# Patient Record
Sex: Male | Born: 1945 | Race: White | Hispanic: No | Marital: Married | Smoking: Former smoker
Health system: Southern US, Community
[De-identification: ages and names within clinical notes are randomized; demographics above are authoritative.]

## PROBLEM LIST (undated history)

## (undated) DIAGNOSIS — K219 Gastro-esophageal reflux disease without esophagitis: Secondary | ICD-10-CM

## (undated) DIAGNOSIS — I2510 Atherosclerotic heart disease of native coronary artery without angina pectoris: Secondary | ICD-10-CM

## (undated) DIAGNOSIS — F17201 Nicotine dependence, unspecified, in remission: Secondary | ICD-10-CM

## (undated) DIAGNOSIS — M6530 Trigger finger, unspecified finger: Secondary | ICD-10-CM

## (undated) DIAGNOSIS — F419 Anxiety disorder, unspecified: Secondary | ICD-10-CM

## (undated) DIAGNOSIS — C801 Malignant (primary) neoplasm, unspecified: Secondary | ICD-10-CM

## (undated) DIAGNOSIS — K635 Polyp of colon: Secondary | ICD-10-CM

## (undated) DIAGNOSIS — I219 Acute myocardial infarction, unspecified: Secondary | ICD-10-CM

## (undated) DIAGNOSIS — I739 Peripheral vascular disease, unspecified: Secondary | ICD-10-CM

## (undated) DIAGNOSIS — R011 Cardiac murmur, unspecified: Secondary | ICD-10-CM

## (undated) DIAGNOSIS — I10 Essential (primary) hypertension: Secondary | ICD-10-CM

## (undated) DIAGNOSIS — K8020 Calculus of gallbladder without cholecystitis without obstruction: Secondary | ICD-10-CM

## (undated) DIAGNOSIS — E785 Hyperlipidemia, unspecified: Secondary | ICD-10-CM

## (undated) DIAGNOSIS — H9190 Unspecified hearing loss, unspecified ear: Secondary | ICD-10-CM

## (undated) HISTORY — DX: Gastro-esophageal reflux disease without esophagitis: K21.9

## (undated) HISTORY — DX: Calculus of gallbladder without cholecystitis without obstruction: K80.20

## (undated) HISTORY — DX: Nicotine dependence, unspecified, in remission: F17.201

## (undated) HISTORY — DX: Atherosclerotic heart disease of native coronary artery without angina pectoris: I25.10

## (undated) HISTORY — DX: Hyperlipidemia, unspecified: E78.5

## (undated) HISTORY — DX: Trigger finger, unspecified finger: M65.30

## (undated) HISTORY — DX: Polyp of colon: K63.5

## (undated) HISTORY — PX: APPENDECTOMY: SHX54

## (undated) HISTORY — DX: Peripheral vascular disease, unspecified: I73.9

## (undated) HISTORY — PX: FINGER TENDON REPAIR: SHX1640

## (undated) HISTORY — DX: Essential (primary) hypertension: I10

## (undated) HISTORY — PX: CARDIAC CATHETERIZATION: SHX172

---

## 1994-09-12 DIAGNOSIS — I219 Acute myocardial infarction, unspecified: Secondary | ICD-10-CM

## 1994-09-12 HISTORY — DX: Acute myocardial infarction, unspecified: I21.9

## 1995-02-13 ENCOUNTER — Encounter (INDEPENDENT_AMBULATORY_CARE_PROVIDER_SITE_OTHER): Payer: Self-pay | Admitting: *Deleted

## 2005-04-14 ENCOUNTER — Ambulatory Visit: Payer: Self-pay | Admitting: Cardiology

## 2006-04-27 ENCOUNTER — Ambulatory Visit: Payer: Self-pay | Admitting: Cardiology

## 2007-05-16 ENCOUNTER — Ambulatory Visit: Payer: Self-pay | Admitting: Cardiology

## 2007-09-13 DIAGNOSIS — K635 Polyp of colon: Secondary | ICD-10-CM

## 2007-09-13 DIAGNOSIS — K8020 Calculus of gallbladder without cholecystitis without obstruction: Secondary | ICD-10-CM

## 2007-09-13 HISTORY — DX: Calculus of gallbladder without cholecystitis without obstruction: K80.20

## 2007-09-13 HISTORY — DX: Polyp of colon: K63.5

## 2007-09-13 HISTORY — PX: COLONOSCOPY W/ POLYPECTOMY: SHX1380

## 2007-09-24 ENCOUNTER — Ambulatory Visit (HOSPITAL_COMMUNITY): Admission: RE | Admit: 2007-09-24 | Discharge: 2007-09-24 | Payer: Self-pay | Admitting: Family Medicine

## 2007-10-05 ENCOUNTER — Ambulatory Visit (HOSPITAL_COMMUNITY): Admission: RE | Admit: 2007-10-05 | Discharge: 2007-10-05 | Payer: Self-pay | Admitting: Family Medicine

## 2007-10-10 ENCOUNTER — Ambulatory Visit: Payer: Self-pay | Admitting: Internal Medicine

## 2007-10-15 ENCOUNTER — Encounter: Payer: Self-pay | Admitting: Internal Medicine

## 2007-10-15 ENCOUNTER — Ambulatory Visit: Payer: Self-pay | Admitting: Internal Medicine

## 2007-10-15 ENCOUNTER — Ambulatory Visit (HOSPITAL_COMMUNITY): Admission: RE | Admit: 2007-10-15 | Discharge: 2007-10-15 | Payer: Self-pay | Admitting: Internal Medicine

## 2008-01-18 ENCOUNTER — Ambulatory Visit (HOSPITAL_COMMUNITY): Admission: RE | Admit: 2008-01-18 | Discharge: 2008-01-18 | Payer: Self-pay | Admitting: Internal Medicine

## 2008-01-18 ENCOUNTER — Ambulatory Visit: Payer: Self-pay | Admitting: Internal Medicine

## 2008-01-18 ENCOUNTER — Encounter: Payer: Self-pay | Admitting: Internal Medicine

## 2008-10-10 ENCOUNTER — Encounter: Payer: Self-pay | Admitting: Physician Assistant

## 2008-10-10 ENCOUNTER — Ambulatory Visit: Payer: Self-pay | Admitting: Cardiology

## 2008-11-07 ENCOUNTER — Encounter: Payer: Self-pay | Admitting: Gastroenterology

## 2009-01-09 ENCOUNTER — Encounter (INDEPENDENT_AMBULATORY_CARE_PROVIDER_SITE_OTHER): Payer: Self-pay | Admitting: *Deleted

## 2009-07-16 ENCOUNTER — Encounter (INDEPENDENT_AMBULATORY_CARE_PROVIDER_SITE_OTHER): Payer: Self-pay

## 2009-07-20 ENCOUNTER — Encounter: Payer: Self-pay | Admitting: Cardiology

## 2009-09-30 ENCOUNTER — Encounter (INDEPENDENT_AMBULATORY_CARE_PROVIDER_SITE_OTHER): Payer: Self-pay | Admitting: *Deleted

## 2009-11-09 ENCOUNTER — Encounter (INDEPENDENT_AMBULATORY_CARE_PROVIDER_SITE_OTHER): Payer: Self-pay | Admitting: *Deleted

## 2009-11-09 ENCOUNTER — Ambulatory Visit: Payer: Self-pay | Admitting: Cardiology

## 2009-11-09 IMAGING — RF DG UGI W/ HIGH DENSITY W/KUB
13 of 24 series · 13 of 24 positions shown · non-contrast
Comparison: none

HISTORY: Epigastric pain, gastroesophageal reflux, burning

[Series 1: run · 1 of 1 slices shown (1 of 13)]
[im 1/1]
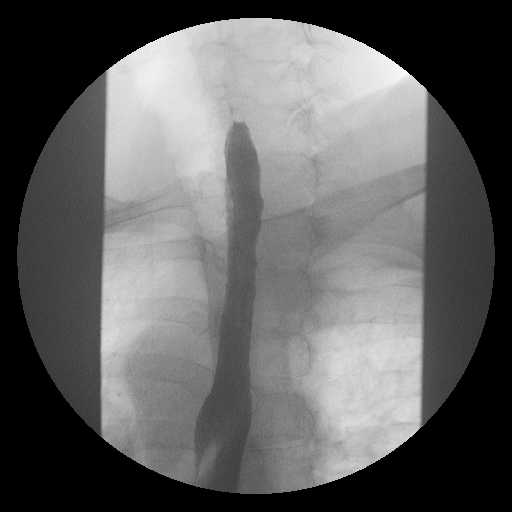

[Series 3: run · 1 of 1 slices shown (2 of 13)]
[im 1/1]
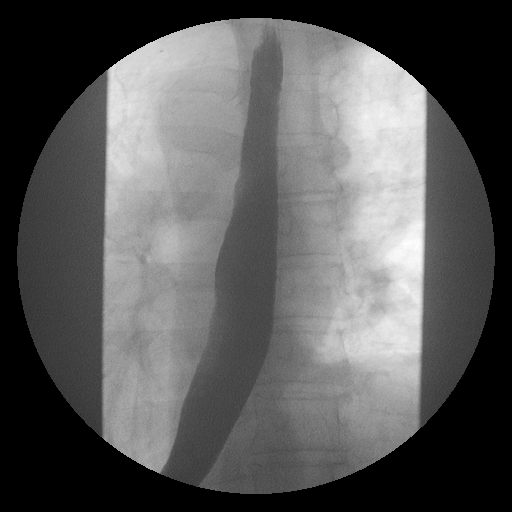

[Series 5: run · 1 of 1 slices shown (3 of 13)]
[im 1/1]
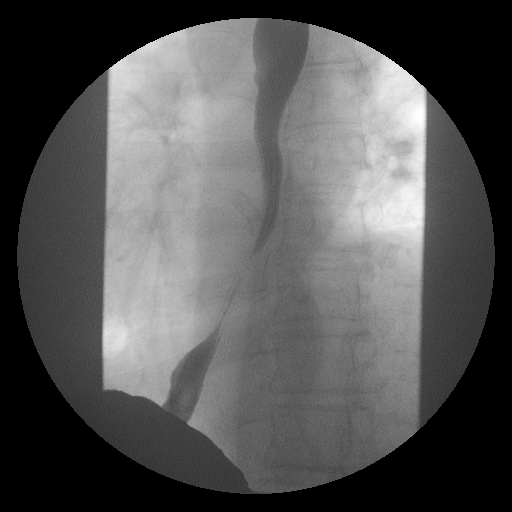

[Series 7: run · 1 of 1 slices shown (4 of 13)]
[im 1/1]
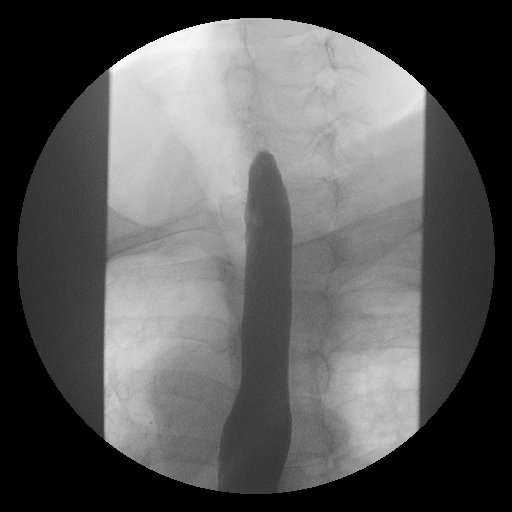

[Series 9: run · 1 of 1 slices shown (5 of 13)]
[im 1/1]
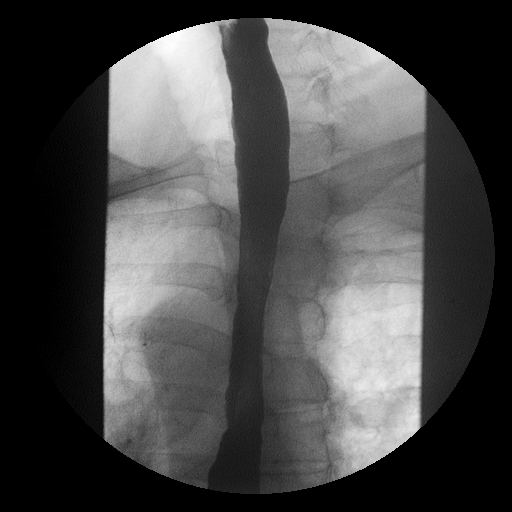

[Series 11: run · 1 of 1 slices shown (6 of 13)]
[im 1/1]
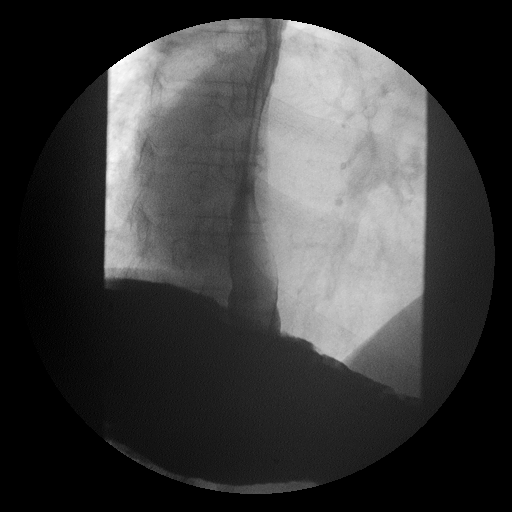

[Series 13: run · 1 of 1 slices shown (7 of 13)]
[im 1/1]
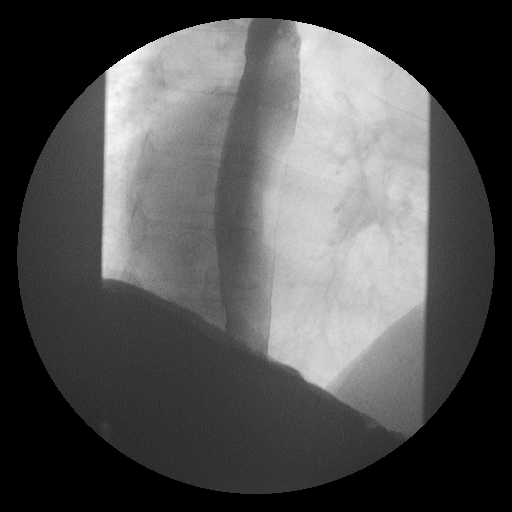

[Series 14: run · 1 of 1 slices shown (8 of 13)]
[im 1/1]
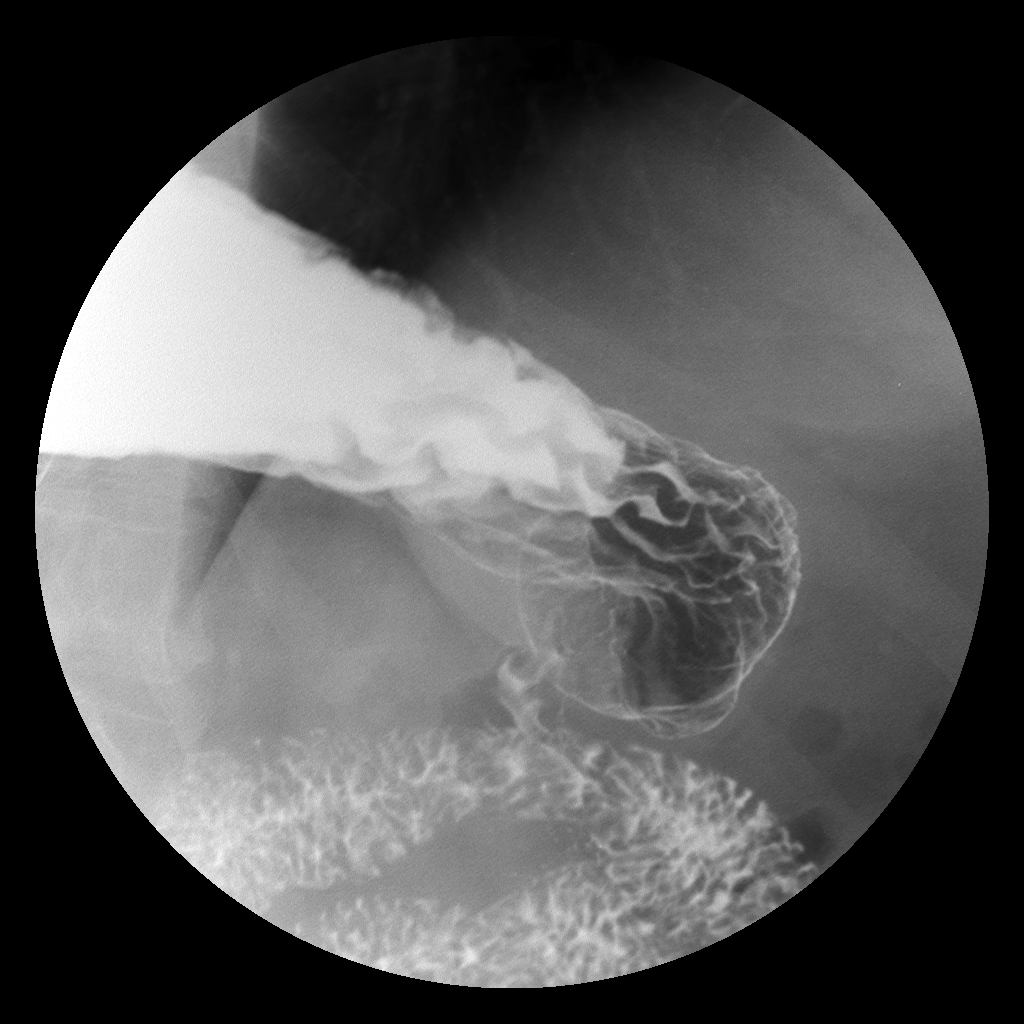

[Series 16: run · 1 of 1 slices shown (9 of 13)]
[im 1/1]
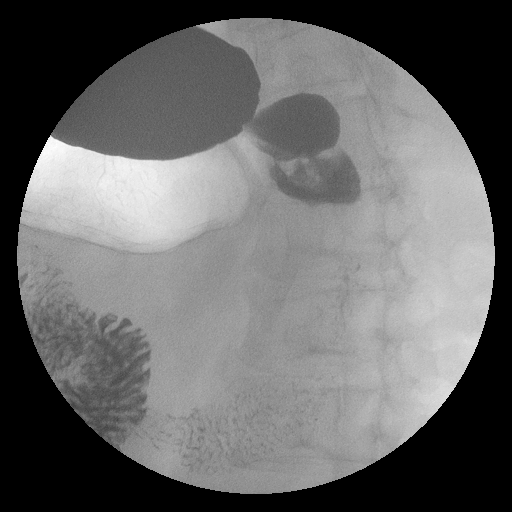

[Series 18: run · 1 of 1 slices shown (10 of 13)]
[im 1/1]
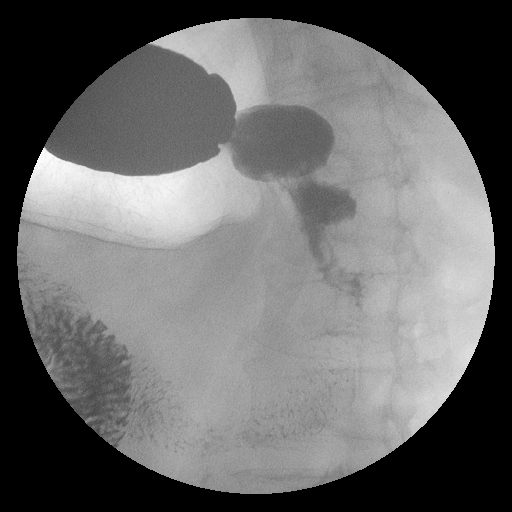

[Series 20: run · 1 of 1 slices shown (11 of 13)]
[im 1/1]
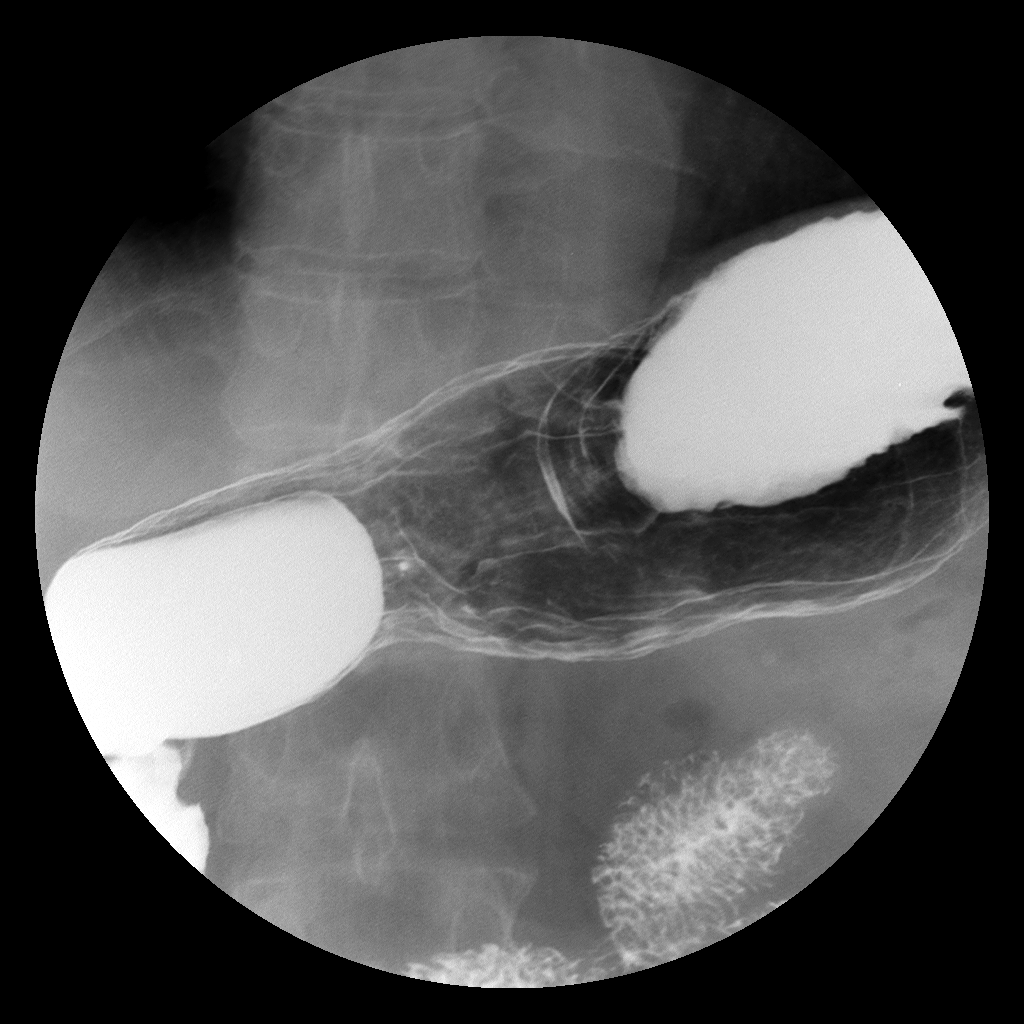

[Series 22: run · 1 of 1 slices shown (12 of 13)]
[im 1/1]
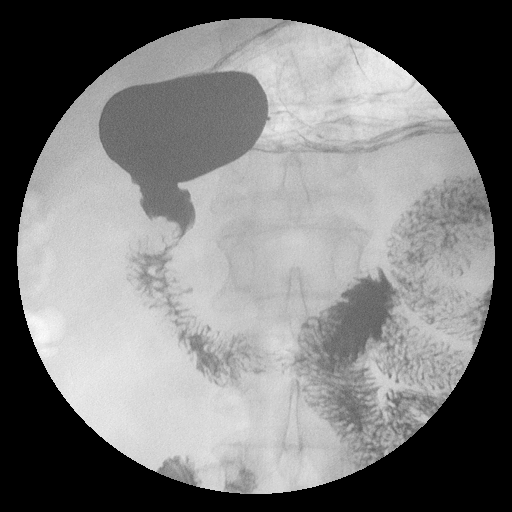

[Series 24: run · 1 of 1 slices shown (13 of 13)]
[im 1/1]
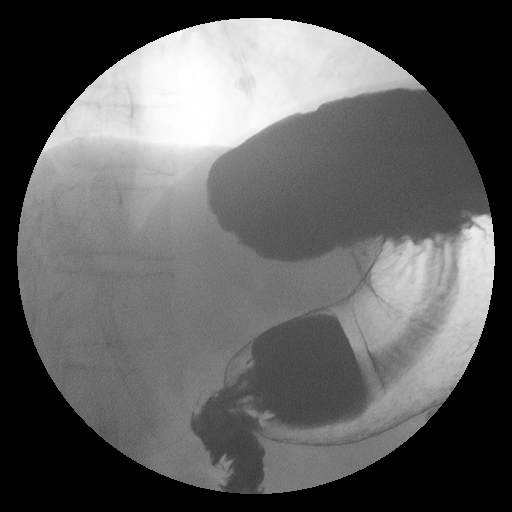

[13 of 24 positions shown; findings below may reference images not displayed]

UPPER GI WITH HIGH-DENSITY WITH KUB:

Normal bowel gas pattern on scout image.
No pathologic calcification or focal bone abnormality.
Routine air-contrast upper GI exam performed.

Normal esophageal distention and motility.
No esophageal stricture or mass.
Esophageal wall appears smooth without ulceration or irregularity. 
No persistent intraluminal filling defects.
Horizontal stomach with normal rugal fold pattern.
No gastric mass or ulceration.
Duodenal bulb and sweep normal appearance.
Duodenal loop normal morphology with normal position of ligament of Treitz.
No gastroesophageal reflux identified during exam.
Visualized jejunal loops normal.
IMPRESSION: Horizontal stomach, normal variant.
Otherwise normal exam.

## 2009-11-16 ENCOUNTER — Encounter (INDEPENDENT_AMBULATORY_CARE_PROVIDER_SITE_OTHER): Payer: Self-pay | Admitting: *Deleted

## 2009-11-16 ENCOUNTER — Encounter: Payer: Self-pay | Admitting: Cardiology

## 2009-11-16 LAB — CONVERTED CEMR LAB
ALT: 22 units/L (ref 0–53)
AST: 17 units/L
AST: 17 units/L (ref 0–37)
Alkaline Phosphatase: 40 units/L
BUN: 10 mg/dL
Basophils Absolute: 0 10*3/uL
Basophils Relative: 1 %
Basophils Relative: 1 % (ref 0–1)
CO2: 29 meq/L
CO2: 29 meq/L (ref 19–32)
Cholesterol: 126 mg/dL
Cholesterol: 126 mg/dL (ref 0–200)
Creatinine, Ser: 1.04 mg/dL
Creatinine, Ser: 1.04 mg/dL (ref 0.40–1.50)
Eosinophils Absolute: 0.1 10*3/uL
Eosinophils Absolute: 0.1 10*3/uL (ref 0.0–0.7)
Glucose, Bld: 89 mg/dL
HCT: 42.7 %
HDL: 40 mg/dL (ref >39)
Hemoglobin: 14 g/dL
Lymphs Abs: 2.1 10*3/uL
MCHC: 32.8 g/dL
MCHC: 32.8 g/dL (ref 30.0–36.0)
MCV: 90.5 fL
MCV: 90.5 fL (ref 78.0–100.0)
Neutrophils Relative %: 55 % (ref 43–77)
OCCULT 2: NEGATIVE
Platelets: 216 10*3/uL (ref 150–400)
RDW: 13.8 %
Total Bilirubin: 1.3 mg/dL — ABNORMAL HIGH (ref 0.3–1.2)
Total CHOL/HDL Ratio: 3.2
Triglycerides: 152 mg/dL
VLDL: 30 mg/dL (ref 0–40)

## 2009-11-17 ENCOUNTER — Encounter (INDEPENDENT_AMBULATORY_CARE_PROVIDER_SITE_OTHER): Payer: Self-pay | Admitting: *Deleted

## 2009-11-23 ENCOUNTER — Encounter (INDEPENDENT_AMBULATORY_CARE_PROVIDER_SITE_OTHER): Payer: Self-pay | Admitting: *Deleted

## 2010-03-04 ENCOUNTER — Encounter (INDEPENDENT_AMBULATORY_CARE_PROVIDER_SITE_OTHER): Payer: Self-pay

## 2010-10-14 NOTE — Assessment & Plan Note (Signed)
Summary: 1 yr /fu per checkout on 10/10/08/tg   Visit Type:  Follow-up Primary Provider:  Dr. Butch Penny   History of Present Illness: Return visit for this very pleasant 65 year old gentleman with coronary disease and multiple cardiovascular risk factors.  Since his last visit, in fact in the intervals between his last 15 visits, he has remained well.  He notes no cardiovascular symptoms despite an active lifestyle.  He is recently retired from [employer] work.  Blood pressure control has been good.  He has gained some weight, but vows to lose it.  He has not had any recent laboratory studies.  Current Medications (verified): 1)  Metoprolol Tartrate 50 Mg Tabs (Metoprolol Tartrate) .... Take One Tablet By Mouth Twice A Day 2)  Lipitor 40 Mg Tabs (Atorvastatin Calcium) .... Take 1 Tablet By Mouth Once Daily 3)  Chlorthalidone 25 Mg Tabs (Chlorthalidone) .... Take 1 Tablet By Mouth Once A Day 4)  Ramipril 10 Mg Caps (Ramipril) .... Take 1 Capsule By Mouth Daily 5)  Aciphex 20 Mg Tbec (Rabeprazole Sodium) .... Take 1 Tab Daily 6)  Fish Oil 1000 Mg Caps (Omega-3 Fatty Acids) .... Take 1 Cap Two Times A Day  Allergies (verified): No Known Drug Allergies  Past History:  PMH, FH, and Social History reviewed and updated.  Review of Systems       The patient complains of decreased hearing.  The patient denies weight loss, weight gain, vision loss, hoarseness, chest pain, syncope, dyspnea on exertion, peripheral edema, prolonged cough, headaches, hemoptysis, abdominal pain, melena, and hematochezia.    Vital Signs:  Patient profile:   65 year old male Height:      71 inches Weight:      227 pounds BMI:     31.77 Pulse rate:   63 / minute BP sitting:   112 / 77  (right arm)  Vitals Entered By: Dreama Saa, CNA (November 09, 2009 1:13 PM)  Physical Exam  General:  He is a well-nourished, well-developed male in no acute distress. NECK:  Without JVD. CARDIAC:  Normal S1 and S2.   Regular rate and rhythm. Modest early systolic murmur. LUNGS:  Clear to auscultation bilaterally. ABDOMEN:  Soft and nontender. EXTREMITIES:  Without edema. NEUROLOGIC:  He is alert and oriented x3.  Cranial II-XII are intact. VASCULAR:  No carotid bruits noted bilaterally.    Distal pulses are palpable except for the left dorsalis pedis.   Impression & Recommendations:  Problem # 1:  HYPERTENSION (ICD-401.9) Blood pressure control is excellent; current medications will be continued.  Problem # 2:  HYPERLIPIDEMIA (ICD-272.4) Lipid profile was excellent last year.  A repeat assessment will be undertaken and medication adjusted accordingly.  A complete metabolic profile, CBC and stool for Hemoccult testing will also be obtained.  Problem # 3:  ATHEROSCLEROTIC CARDIOVASCULAR DISEASE (ICD-429.2) He remained symptom-free a decade and a half following percutaneous intervention.  This may be a testament to excellent control of risk factors, a course of action which will be continued.  I will plan to reassess this very nice gentleman in one year.  Other Orders: Hemoccult Cards (Take Home) (Hemoccult Cards) Future Orders: T-Lipid Profile ([id number]) ... 11/16/2009 T-CBC w/Diff ([id number]) ... 11/16/2009 T-Comprehensive Metabolic Panel [id number]) ... 11/16/2009  Patient Instructions: 1)  Your physician recommends that you schedule a follow-up appointment in: 1 YEAR 2)  Your physician recommends that you return for lab work in: NEXT WEEK 3)  Your physician has asked that you test your stool  for blood. It is necessary to test 3 different stool specimens for accuracy. You will be given 3 hemoccult cards for specimen collection. For each stool specimen, place a small portion of stool sample (from 2 different areas of the stool) into the 2 squares on the card. Close card. Repeat with 2 more stool specimens. Bring the cards back to the office for testing.

## 2010-10-14 NOTE — Miscellaneous (Signed)
Summary: stool cards 11/16/2009  Clinical Lists Changes  Observations: Added new observation of HEMOCCULT 3: neg (11/16/2009 15:49) Added new observation of HEMOCCULT 2: neg (11/16/2009 15:49) Added new observation of HEMOCCULT 1: neg (11/16/2009 15:49)

## 2010-10-14 NOTE — Miscellaneous (Signed)
Summary: lipitor refill  Clinical Lists Changes  Medications: Rx of LIPITOR 40 MG TABS (ATORVASTATIN CALCIUM) take 1 tablet by mouth once daily;  #30 x 1;  Signed;  Entered by: Teressa Lower RN;  Authorized by: Kathlen Brunswick, MD, [facility];  Method used: Electronically to [facility] [address], [place], [place], [place]  [address], Ph: [phone] or [phone], Fax: [phone]    Prescriptions: LIPITOR 40 MG TABS (ATORVASTATIN CALCIUM) take 1 tablet by mouth once daily  #30 x 1   Entered by:   Teressa Lower RN   Authorized by:   Kathlen Brunswick, MD, [facility]   Signed by:   Teressa Lower RN on 09/30/2009   Method used:   Electronically to        [facility] [address] (retail)       [address]       [place], [place]  [address]       Ph: [phone] or [phone]       Fax: [phone]   RxID:   [id number]

## 2010-10-14 NOTE — Letter (Signed)
Summary: Recall Colonoscopy/Endoscopy, Change to Office Visit  [facility]  [address]   [place], [place] [address]   Phone: [phone]  Fax: [phone]      March 04, 2010   [facility] [address] [facility] [address] [place], [place]  [address] 07-Jun-1946   Dear Mr. Staver,   According to our records, it is time for you to schedule a Colonoscopy/Endoscopy. However, after reviewing your medical record, we recommend an office visit in order to determine your need for a repeat procedure.  Please call [phone] at your convenience to schedule an office visit. If you have any questions or concerns, please feel free to contact our office.   Sincerely,   Cloria Spring LPN  [facility] Ph: [phone]   Fax: [phone]

## 2010-10-14 NOTE — Letter (Signed)
Summary: [place] [facility] Work Engineer, agricultural at [employer]  [address], [place] [address]   Phone: [phone]  Fax: [phone]     November 09, 2009 MRN: [id number]   Phillip Preston [address] [place], [place]  [address]      YOUR LAB WORK IS DUE  MONDAY _________________________________________  Please go to [facility], located across the street from [facility] on the second floor.  Hours are Monday - Friday 7am until 7:30pm         Saturday 8am until 12noon    _X_  DO NOT EAT OR DRINK AFTER MIDNIGHT EVENING PRIOR TO LABWORK  __ YOUR LABWORK IS NOT FASTING --YOU MAY EAT PRIOR TO LABWORK

## 2010-10-14 NOTE — Letter (Signed)
Summary: [place] Results Engineer, agricultural at [facility]  [address] [place] [address]   Phone: [phone]  Fax: [phone]      November 17, 2009 MRN: [id number]   Phillip Preston [address] [place], [place]  [address]   Dear Mr. Dellis,  Your test ordered by Selena Batten has been reviewed by your physician (or physician assistant) and was found to be normal or stable. Your physician (or physician assistant) felt no changes were needed at this time.  ____ Echocardiogram  ____ Cardiac Stress Test  __x__ Lab Work  ____ Peripheral vascular study of arms, legs or neck  ____ CT scan or X-ray  ____ Lung or Breathing test  ____ Other:  No change in medical treatment at this time, per Dr. Dietrich Pates.  Enclosed is a copy of your labwork for your records.   Thank you, Agueda Houpt Allyne Gee RN    McIntosh Bing, MD, [facility] Arena.C.Gaylord Shih, MD, F.A.C.C Lewayne Bunting, MD, F.A.C.C Nona Dell, MD, F.A.C.C Charlton Haws, MD, Lenise Arena.C.C

## 2010-10-21 ENCOUNTER — Encounter (INDEPENDENT_AMBULATORY_CARE_PROVIDER_SITE_OTHER): Payer: Self-pay | Admitting: *Deleted

## 2010-10-25 ENCOUNTER — Encounter (INDEPENDENT_AMBULATORY_CARE_PROVIDER_SITE_OTHER): Payer: Self-pay | Admitting: *Deleted

## 2010-10-25 ENCOUNTER — Ambulatory Visit (INDEPENDENT_AMBULATORY_CARE_PROVIDER_SITE_OTHER): Payer: BC Managed Care – PPO | Admitting: Cardiology

## 2010-10-25 ENCOUNTER — Encounter: Payer: Self-pay | Admitting: Cardiology

## 2010-10-25 DIAGNOSIS — E782 Mixed hyperlipidemia: Secondary | ICD-10-CM

## 2010-10-25 DIAGNOSIS — I10 Essential (primary) hypertension: Secondary | ICD-10-CM

## 2010-10-25 DIAGNOSIS — I2510 Atherosclerotic heart disease of native coronary artery without angina pectoris: Secondary | ICD-10-CM

## 2010-10-28 NOTE — Miscellaneous (Signed)
Summary: labs cbcd,cmp,lipids,11/16/2009  Clinical Lists Changes  Observations: Added new observation of CALCIUM: 9.1 mg/dL (82/95/6213 08:65) Added new observation of ALBUMIN: 4.4 g/dL (78/46/9629 52:84) Added new observation of PROTEIN, TOT: 7.1 g/dL (13/24/4010 27:25) Added new observation of SGPT (ALT): 22 units/L (11/16/2009 10:45) Added new observation of SGOT (AST): 17 units/L (11/16/2009 10:45) Added new observation of ALK PHOS: 40 units/L (11/16/2009 10:45) Added new observation of CREATININE: 1.04 mg/dL (36/64/4034 74:25) Added new observation of BUN: 10 mg/dL (95/63/8756 43:32) Added new observation of BG RANDOM: 89 mg/dL (95/18/8416 60:63) Added new observation of CO2 PLSM/SER: 29 meq/L (11/16/2009 10:45) Added new observation of CL SERUM: 99 meq/L (11/16/2009 10:45) Added new observation of K SERUM: 4.0 meq/L (11/16/2009 10:45) Added new observation of NA: 141 meq/L (11/16/2009 10:45) Added new observation of LDL: 56 mg/dL (01/60/1093 23:55) Added new observation of HDL: 40 mg/dL (73/22/0254 27:06) Added new observation of TRIGLYC TOT: 152 mg/dL (23/76/2831 51:76) Added new observation of CHOLESTEROL: 126 mg/dL (16/03/3709 62:69) Added new observation of ABSOLUTE BAS: 0.0 K/uL (11/16/2009 10:45) Added new observation of BASOPHIL %: 1 % (11/16/2009 10:45) Added new observation of EOS ABSLT: 0.1 K/uL (11/16/2009 10:45) Added new observation of % EOS AUTO: 2 % (11/16/2009 10:45) Added new observation of ABSOLUTE MON: 0.4 K/uL (11/16/2009 10:45) Added new observation of MONOCYTE %: 7 % (11/16/2009 10:45) Added new observation of ABS LYMPHOCY: 2.1 K/uL (11/16/2009 10:45) Added new observation of LYMPHS %: 36 % (11/16/2009 10:45) Added new observation of PLATELETK/UL: 216 K/uL (11/16/2009 10:45) Added new observation of RDW: 13.8 % (11/16/2009 10:45) Added new observation of MCHC RBC: 32.8 g/dL (48/54/6270 35:00) Added new observation of MCV: 90.5 fL (11/16/2009 10:45) Added  new observation of HCT: 42.7 % (11/16/2009 10:45) Added new observation of HGB: 14.0 g/dL (93/81/8299 37:16) Added new observation of RBC M/UL: 4.72 M/uL (11/16/2009 10:45) Added new observation of WBC COUNT: 5.9 10*3/microliter (11/16/2009 10:45)

## 2010-11-01 LAB — CONVERTED CEMR LAB
Albumin: 4.5 g/dL (ref 3.5–5.2)
BUN: 15 mg/dL (ref 6–23)
CO2: 31 meq/L (ref 19–32)
Calcium: 9.7 mg/dL (ref 8.4–10.5)
Chloride: 98 meq/L (ref 96–112)
Cholesterol: 147 mg/dL (ref 0–200)
Creatinine, Ser: 1.22 mg/dL (ref 0.40–1.50)
HDL: 40 mg/dL (ref >39)
Potassium: 3.9 meq/L (ref 3.5–5.3)
Total CHOL/HDL Ratio: 3.7

## 2010-11-03 NOTE — Assessment & Plan Note (Signed)
Summary: 1 year f/u per checkout on 11/09/09/tg/lv   Visit Type:  Follow-up Referring Provider:  GI-Dr. Jena Gauss Primary Provider:  Dr. Butch Penny   History of Present Illness: Phillip Preston returns to the office as scheduled for continued assessment and treatment of coronary artery disease.  Since last visit, he has done quite well.  He reports no new medical problems and no visits to the emergency department or hospital.  He has noted dyspnea, orthopnea nor PND.  He has occasional chest discomfort that he attributes to gastroesophageal reflux disease, which is improved since he started treatment with omeprazole.  He monitors blood pressure at his local pharmacy with systolics typically in the 130s and diastolics in the 70s or 80s.  Current Medications (verified): 1)  Metoprolol Tartrate 50 Mg Tabs (Metoprolol Tartrate) .... Take One Tablet By Mouth Twice A Day 2)  Lipitor 40 Mg Tabs (Atorvastatin Calcium) .... Take 1 Tablet By Mouth Once Daily 3)  Chlorthalidone 25 Mg Tabs (Chlorthalidone) .... Take 1 Tablet By Mouth Once A Day 4)  Ramipril 10 Mg Caps (Ramipril) .... Take 1 Capsule By Mouth Daily 5)  Fish Oil 1000 Mg Caps (Omega-3 Fatty Acids) .... Take 1 Cap Two Times A Day 6)  Omeprazole 20 Mg Cpdr (Omeprazole) .... Take 1 Tab Daily  Allergies (verified): No Known Drug Allergies  Comments:  Nurse/Medical Assistant: no meds no list the only change is he stopped aciphex and started omeprazole  20 mg daily  Past History:  PMH, FH, and Social History reviewed and updated.  Past Medical History: ASCVD-IMI requiring RCA stent; EF- 50%;repeat RCA stent in 6/96 with residual 50-60% mid LAD & Cx Hypertension Hyperlipidemia Tobacco abuse-quit in 1996 after total consumption of 30-40 pack years Peripheral vascular disease-decreased distal pulses Contracture of right fifth finger Gastroesophageal reflux disease Colonic polyps removed via colonoscopy    Review of Systems   See history of present illness.  Vital Signs:  Patient profile:   65 year old male Weight:      233 pounds BMI:     32.61 O2 Sat:      97 % on Room air Pulse rate:   82 / minute BP sitting:   145 / 89  (left arm)  Vitals Entered By: Dreama Saa, CNA (October 25, 2010 3:06 PM)  O2 Flow:  Room air  Physical Exam  General:  Mildly overweight; well developed; no acute distress:   Neck-No JVD; no carotid bruits: Lungs-No tachypnea,; no rhonchi; no wheezes; few rales at the left base. Cardiovascular-normal PMI; normal S1 and S2; minimal basilar systolic ejection murmur Abdomen-BS normal; soft and non-tender without masses or organomegaly:  Musculoskeletal-No deformities, no cyanosis or clubbing: Neurologic-Normal cranial nerves; symmetric strength and tone:  Skin-Warm, no significant lesions: Extremities:1-2+ distal pulses; no edema; right fifth trigger finger     Impression & Recommendations:  Problem # 1:  HYPERTENSION (ICD-401.9) Repeat blood pressure was 140/90, but multiple readings obtained as an outpatient have been well within a desirable range.  Current medications will be continued.  Chemistry profile pending.  Problem # 2:  HYPERLIPIDEMIA (ICD-272.4) Lipid profile last year was excellent; a repeat study will be obtained.  CHOL: 126 (11/16/2009)   LDL: 56 (11/16/2009)   HDL: 40 (11/16/2009)   TG: 152 (11/16/2009)  Problem # 3:  ATHEROSCLEROTIC CARDIOVASCULAR DISEASE (ICD-429.2) Despite moderate two-vessel disease by catheterization 15 years ago, his clinical course has been amazingly benign.  We will continue our successful strategy of optimally controlling  her cardiovascular risk factors.  Phillip Preston has done the heavy lifting, discontinuing cigarette smoking and remain active.  We will attempt to increase exercise, which has been limited by caring for his mother who has Alzheimer's, and decreasing caloric intake.  I will see this very nice gentleman again in one  year.  Other Orders: Future Orders: T-Comprehensive Metabolic Panel ([id number]) ... 11/01/2010 T-Lipid Profile [id number]) ... 11/01/2010  Patient Instructions: 1)  Your physician recommends that you schedule a follow-up appointment in: 1 year 2)  Your physician recommends that you return for lab work HY:QMVH week 3)  Your physician has recommended you make the following change in your medication: take omeprazole two times a day when heartburn acts up

## 2010-11-03 NOTE — Letter (Signed)
Summary: [place] [facility] Work Engineer, agricultural at [employer]  [address] [place] [address]   Phone: [phone]  Fax: [phone]     October 25, 2010 MRN: [id number]   WELDON NOURI [address] [place], [place]  [address]      YOUR LAB WORK IS DUE   November 01, 2010  Please go to [facility], located across the street from [facility] on the second floor.  Hours are Monday - Friday 7am until 7:30pm         Saturday 8am until 12noon    _X_  DO NOT EAT OR DRINK AFTER MIDNIGHT EVENING PRIOR TO LABWORK

## 2011-01-25 NOTE — Op Note (Signed)
NAMEDONZEL, ROMACK                 ACCOUNT NO.:  [id number]   MEDICAL RECORD NO.:  [id number]          PATIENT TYPE:  AMB   LOCATION:  DAY                           FACILITY:  [facility]   PHYSICIAN:  R. Roetta Sessions, M.D. DATE OF BIRTH:  1946-05-13   DATE OF PROCEDURE:  DATE OF DISCHARGE:                                PROCEDURE NOTE   Diagnostic EGD following colonoscopy with saline assisted snare  polypectomy.   INDICATIONS FOR PROCEDURE:  A 65 year old gentleman with a 38-month  history of worsening of his typical reflux symptoms despite taking  Protonix 40 mg orally twice daily which previously held his reflux  symptoms under good control with once daily therapy.  He does describe  compelling symptoms for regurgitation reflux.  There is nothing to  suggest coronary ischemia or other etiology.  He also was overdue for  screening colonoscopy.  His last exam was in 1995.  EGD and colonoscopy  __________  discussed with the patient at length potential risks,  benefits, and alternatives have been reviewed, questions answered.  He  is agreeable.  Please see the documentation in the medical record.   PROCEDURE NOTE:  O2 saturation, blood pressure, pulse, and respirations  were monitored throughout the entirety of the procedure.   CONSCIOUS SEDATION:  For both procedures.  Versed 7 mg IV, Demerol 100  mg IV in divided doses.  Cetacaine spray for topical pharyngeal  anesthesia.   INSTRUMENT:  Pentax video chip system.   FINDINGS:  EGD:  Examination of the tubular esophagus revealed  circumferential distal esophageal erosions within 1-cm of the EG  junction.  There was one single area where there were a couple of  skipped linear erosions coming up at 3-cm.  There is no Barrett's  esophagus.  No evidence of neoplasia.  EG junction was easily traversed  __________  stomach.  Gastric cavity was __________ insufflated well  with air.  Thorough examination of the gastric mucosa, including  retroflexion of the proximal stomach esophagogastric junction  demonstrated multiple antral erosions and a small hiatal hernia.  Otherwise the gastric mucosa appeared normal, patent.  Pylorus patent  easily traversed __________  of the bulb second portion revealed no  abnormalities.  Therapeutic/diagnostic maneuvers performed:  None.   The patient tolerated the procedure well.   Then he was prepared for colonoscopy digital rectal exam revealed no  abnormalities.  __________  prep was adequate.  Colonic mucosa was  surveyed from the rectosigmoid junction to the left __________ colon  __________  seen.   The following abnormalities were noted:  1. There was an approximately 1-to-2-cm neoplastic-appearing process      oblong, sausage shaped, more or less sessile polypoid lesion      approximately in the base of the cecum.  There was an adjacent 6-mm      polyp.  Please see multiple photographs utilizing the sclero needle      and normal saline approximately 6 mL of normal saline was used to      lift this lesion away from the mucosa.  It did indeed  lift away      from the mucosa very nicely.  Subsequently, using snare cautery,      this was removed in a piecemeal fashion.  I used a fair amount of      energy and got the majority of this polyp off.  It did leave a      notable crater there.  Appeared to be a little residual polypoid      tissue present but I felt it not safe to attempt further resection      at this time.  Multiple large fragments of this lesion (the      majority of it) were suctioned through the scope and the two      largest pieces were engaged with a Lucina Mellow net and pulled out of the      patient as all previously mentioned mucosal surfaces were again      seen.  The adjacent polyp to this large cecal polyp was removed      with hot snare cautery and also recovered.  The scope was pulled      down to the rectum.  The __________  was removed.  The polyp      fragments  were recovered.  2. The scope was reintroduced into the rectum for a thorough      examination of the rectal mucosa including retroflexion to the anal      verge which demonstrated a 7-mm rectal polyp in approximately 8-to-      10-cm from the anal verge.  It was removed with hot snare cautery.      The remainder of the rectal mucosa appeared normal.  The patient      tolerated the rather lengthy procedure well and reactive at      endoscopy.   IMPRESSION:  1. Rectal polyps, status post snare polypectomy as described above,      otherwise normal rectum.  2. Left sided diverticula.  3. Complex polypoid lesion at the base of the cecum with an adjacent      smaller polyp.  This lesion was largely removed with a piecemeal      polypectomy, however, there was felt to be some residual polyp left      behind but the majority of this lesion was removed.  The adjacent      polyp was removed with hot snare cautery.  4. The remainder of colonic mucosa appeared normal.   RECOMMENDATIONS:  1. The patient was admonished not to take any aspirin or nonsteroidals      absolutely for the next 10 days.  2. Diverticulosis list provided to Mr. Evette Cristal.  3. Stop Protonix and begin Zegerid 40 mg orally daily for      gastroesophageal reflux disease.  He is to take the capsule before      breakfast.  4. Should he develop any rectal bleeding or unusual abdominal pain, he      is to let me know at once.  5. Follow up on path.  6. If he has invasive carcinoma in the cecal lesion, he will need a      cecectomy.  If this is an adenoma, we will plan to bring him back      in 3 months and size up this area and remove whatever residual      polypoid tissue that remains.   Further recommendations to follow.      Jonathon Bellows, M.D.  Electronically Signed  RMR/MEDQ  D:  10/15/2007  T:  10/15/2007  Job:  [id number]   cc:   Angus G. Renard Matter, MD  Fax: [phone]

## 2011-01-25 NOTE — Letter (Signed)
May 16, 2007     RE:  Phillip Preston, Phillip Preston  MRN:  [id number]  /  DOB:  Apr 28, 1946   ADDENDUM   [name] has a trigger finger and requests referral to a hand surgeon.  We will attempt to find an appropriate surgeon who can effectively  repair this problem.    Sincerely,      Gerrit Friends. Dietrich Pates, MD, [facility]  Electronically Signed    RMR/MedQ  DD: 05/16/2007  DT: 05/16/2007  Job #: [id number]

## 2011-01-25 NOTE — Op Note (Signed)
NAMEMONTARIO, ZILKA                 ACCOUNT NO.:  [id number]   MEDICAL RECORD NO.:  [id number]          PATIENT TYPE:  AMB   LOCATION:  DAY                           FACILITY:  [facility]   PHYSICIAN:  R. Roetta Sessions, M.D. 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  It was felt that some residual  polyp tissue may have been left behind consequently to being brought  back early.  Repeat colonoscopy is now being done.  Risks, benefits,  alternations, and limitations have been reviewed.  Questions were  answered.  He is agreeable.  Please see the documentation in the medical  record.   PROCEDURE NOTE:  O2 saturation, blood pressure, pulse of the patient  monitored throughout the entire procedure. Conscious sedation Versed 5  mg IV and Demerol 75 mg IV in divided doses.   INSTRUMENT:  Pentax video chip system.   FINDINGS:  Digital rectal exam revealed no abnormalities.  Endoscopic  findings:  The prep was good.  Colon:  Colonic mucosa was surveyed from  rectosigmoid junction through the left transverse, right colon,  appendiceal orifice, ileocecal valve, and cecum.  The structures were  well seen photographed for the record.  From this level, scope was  slowly withdrawn.  All previously mentioned mucosal surfaces were again  seen.  At the base of the cecum, there was approximately 1 x 1.25 cm  residual area of polyp with some adenoma right on the top of the fold  spanning about 1 cm length.  Please see photos.  There was adjacent 8 mm  polyp behind the fold as well.  Using saline assistance approximately 3  mL of normal saline was injected submucosal which look at this  lesion  away from the colonic wall very nicely; and with hot snare cautery, this  lesion was completely resected in a piecemeal fashion.  Please see  photos before, during, and after and felt that it might not have been  completely resected.  The separate 8 mm polyp was also removed with 1  pass of hot snare cautery.  Multiple fragments were recovered for  pathology.  From this level, the scope was slowly withdrawn.  All  previously mentioned mucosal surfaces were again seen.  The patient had  scattered sigmoid diverticula.  Colonic mucosa appeared normal.  Scope  was pulled down to the rectum and with the examination of the rectal  mucosa including retroflexed view of the anal verge, it demonstrated no  rectal mucosal abnormality.  The patient tolerated the procedure well  and was reacted in endoscopy.   IMPRESSION:  1. Normal rectum, sigmoid diverticula.  2. Cecal polyps removed as described above.   RECOMMENDATIONS:  1. No aspirin or other medications for 10 days.  2.  Follow up on path.  3. Further recommendations to follow.      Jonathon Bellows, M.D.  Electronically Signed     RMR/MEDQ  D:  01/18/2008  T:  01/19/2008  Job:  [id number]

## 2011-01-25 NOTE — H&P (Signed)
Phillip Preston, Phillip Preston                 ACCOUNT NO.:  [id number]   MEDICAL RECORD NO.:  [id number]          PATIENT TYPE:  AMB   LOCATION:  DAY                           FACILITY:  [facility]   PHYSICIAN:  R. Roetta Sessions, M.D. DATE OF BIRTH:  Sep 08, 1946   DATE OF ADMISSION:  DATE OF DISCHARGE:  LH                              HISTORY & PHYSICAL   REFERRING PHYSICIAN:  Angus G. Renard Matter, MD   REASON FOR CONSULTATION:  Refractory GERD.   HISTORY OF PRESENT ILLNESS:  [name] is a 65 year old male.  He began  to have acid reflux about 1 month ago.  He is having a significant  amount of heartburn and indigestion with some retrosternal chest pain.  His symptoms were worse at bedtime when he would lay down.  He also  notes increasing symptoms with bending over.  He would feel acid reflux  run up the back of his throat.  He started Protonix 40 mg daily.  He  still had symptoms while on this.  This has been increased to b.i.d. and  he has had some relief over the last 3 days since he increase his  dosage.  He denies any nausea or vomiting.  Denies any dysphagia or  odynophagia.  Denies any anorexia or satiety.  Denies rectal bleeding,  melena, constipation or diarrhea.  He has always had some problems with  gas and bloating with dairy products and nuts.   He had an abdominal ultrasound on October 05, 2007.  He was found to  have a single stone identified within the gallbladder.  No evidence of  acute cholecystitis.  No CBD dilatation. He had an upper GI series which  showed a horizontal stomach which is a normal variant without any other  findings.   PAST MEDICAL AND SURGICAL HISTORY:  He had a colonoscopy in 1995 by Dr.  Jena Gauss.  He had two hyperplastic polyps removed.  He had left-sided  diverticula.  He is status post appendectomy.  He had coronary artery  disease status post PTCA with history of MI in 1996, hypertension,  hypercholesterolemia.   CURRENT MEDICATIONS:  1. Metoprolol 100 mg  daily.  2. Ramipril 10 mg daily.  3. Lipitor 40 mg daily.  4. Chlorthalidone 25 mg daily.  5. Protonix 40 mg b.i.d.  6. Aspirin 81 mg daily.  7. Fish oil once daily.  8. Multivitamin once daily.   ALLERGIES:  NO KNOWN DRUG ALLERGIES.   FAMILY HISTORY:  There is no known family history of colon carcinoma,  liver or chronic GI problems.  Mother age 51 is healthy.  Father is  deceased at age 89 with a bone cancer.  He has two healthy siblings.   SOCIAL HISTORY:  Phillip Preston is married.  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  He has a 30 pack year history of tobacco use,  quit 13 years ago. Consumes about six beers on average over the  weekends.  Denies any drug use.   REVIEW OF SYSTEMS:  See HPI, otherwise negative.   PHYSICAL EXAM:  VITAL SIGNS:  Weight 222 pounds, height 71 inches,  temperature 97.7, blood pressure 124/76 and Phillip Preston of 86.  GENERAL:  Phillip Preston is a well-developed, well-nourished Caucasian male  in no acute distress.  HEENT:  Sclerae clear.  Non-icteric.  Conjunctivae pink.  Oropharynx  pink, moist without lesions.  NECK: Supple without mass or thyromegaly.  CHEST: Heart regular rate and rhythm.  Normal S1-S2 without murmurs,  rubs or gallops.  LUNGS:  Clear to auscultation bilaterally.  ABDOMEN: Protuberant with positive bowel sounds x4.  No bruits  auscultated.  Soft, nontender without palpable mass or organomegaly.  No  rebound, mass or guarding.  EXTREMITIES: Without edema.  He does have clubbing.   IMPRESSION:  Ms. Speakman is a 65 year old male with a 12-month history of  new onset heartburn and indigestion which has been refractory to PPI  therapy.  Given these findings I suspect erosive esophagitis, but I do  feel we need to rule out complicated GERD including Barrett's esophagus  or occult malignancy given his age.   He does have a single stone in his gallbladder, however, his symptoms  would be atypical for gallbladder disease at this  time.   PLAN:  1. He will have a screening colonoscopy at the same time of diagnostic      EGD.  I have discussed both procedures including risks, benefits to      include but not limited to bleeding, infection, perforation, drug      reaction.  He agrees.  Informed consent will be obtained.  2. Continue Protonix 40 mg b.i.d.  3. Further recommendations to follow.   Thank you Dr. Renard Matter for allowing [place] to participate in the care of [name].      [name], N.P.      Phillip Preston, M.D.  Electronically Signed    KJ/MEDQ  D:  10/10/2007  T:  10/11/2007  Job:  [id number]   cc:   Angus G. Renard Matter, MD  Fax: [phone]

## 2011-01-25 NOTE — Assessment & Plan Note (Signed)
[facility]                       [place] CARDIOLOG[facility] OFFICE NOTE   Phillip Preston, Phillip Preston                        MRN:          [id number]  DATE:10/10/2008                            DOB:          1946-01-12    CARDIOLOGY:  Gerrit Friends. Dietrich Pates, MD, [facility]   PRIMARY CARE PHYSICIAN:  Angus G. Renard Matter, MD   REASON FOR VISIT:  One-year followup.   HISTORY OF PRESENT ILLNESS:  Mr. Ashmead is a 65 year old male patient  with history of coronary artery disease, status post inferior ST  elevation myocardial infarction treated with PTCA and stenting in 1996  with overall preserved LV function.  At the time of his cardiac  catheterization in 1996, he had a 50-60% proximal LAD, 30% proximal LAD,  and 50-60% proximal circumflex.  He has done exceptionally well since  his myocardial infarction in 1996.  He was last seen by Dr. Dietrich Pates in  September 2008.  He has had no symptoms of chest discomfort or shortness  of breath.  He denies any exertional chest heaviness or tightness.  He  describes NYHA class I symptoms.  He denies orthopnea, PND, or pedal  edema.  Denies any palpitations or syncope.  He recently was plagued by  dyspepsia and saw Dr. Jena Gauss, who did an EGD and colonoscopy.  He was  diagnosed with a hiatal hernia and acid reflux disease.  He is now  controlled on Aciphex therapy.   CURRENT MEDICATIONS:  1. Aspirin 81 mg a day.  2. Altace 10 mg a day.  3. Chlorthalidone 12.5 mg a day.  4. Fish oil 2 tablets daily.  5. Lipitor 40 mg daily.  6. Lopressor 50 mg b.i.d.  7. Aciphex 20 mg daily.  8. Nitroglycerin p.r.n. chest pain.   PHYSICAL EXAMINATION:  GENERAL:  He is a well-nourished, well-developed  male in no acute distress.  VITAL SIGNS:  Blood pressure 100/80, pulse 63, and weight 224 pounds.  HEENT:  Normal.  NECK:  Without JVD.  CARDIAC:  Normal S1 and S2.  Regular rate and rhythm.  No murmur.  LUNGS:  Clear to auscultation bilaterally.  ABDOMEN:  Soft and nontender.  EXTREMITIES:  Without edema.  NEUROLOGIC:  He is alert and oriented x3.  Cranial II-XII are grossly  intact.  VASCULAR:  No carotid bruits noted bilaterally.  Femoral pulses  difficult to palpate without bruits bilaterally.  Distal pulses are also  difficult to palpate.   Electrocardiogram reveals sinus rhythm with heart rate of 63,  nonspecific ST-T wave changes.   ASSESSMENT AND PLAN:  1. Coronary artery disease, status post inferior ST-elevation      myocardial infarction in 1996 treated with percutaneous      transluminal coronary angioplasty and stenting to the right      coronary artery and residual disease in the circumflex, left      anterior descending as outlined above with overall preserved left      ventricular function with an ejection fraction of 50% at cardiac      catheterization in 1996.  The patient is doing well without any  symptoms of angina.  No medication changes will be made today.  2. Hypertension, controlled.  3. Dyslipidemia.  The patient continues on Lipitor.  His last      assessment of his lipids demonstrated an optimal LDL of 48.  He      will be set up for repeat lipids and a CMET.   DISPOSITION:  The patient will follow up Dr. Dietrich Pates in 1 year or  sooner p.r.n.  Marland Kitchen      Tereso Newcomer, PA-C  Electronically Signed      Gerrit Friends. Dietrich Pates, MD, [facility]  Electronically Signed   SW/MedQ  DD: 10/10/2008  DT: 10/11/2008  Job #: [id number]   cc:   Angus G. Renard Matter, MD

## 2011-01-25 NOTE — Letter (Signed)
May 16, 2007    Angus G. Renard Matter, MD  [address]  [place], [place] [address]   RE:  Phillip Preston, Phillip Preston  MRN:  [id number]  /  DOB:  February 16, 1946   Dear Thalia Party,   Mr. Bachmeier returns to the office for continued assessment and treatment  of coronary disease and cardiovascular risk factors.  He continues to do  extremely well, now 12 years post-complex intervention for coronary  disease and acute inferior myocardial infarction.  He is asymptomatic  from a cardiac standpoint.  He has no claudication.  He continues to  refrain from cigarette-smoking.  He has had no significant medical  problems over the past 12 months.   MEDICATIONS:  Are unchanged from his last visit.   ON EXAM:  A pleasant gentleman, in no acute distress.  The weight is 213, 5 pounds less than last year.  Blood pressure 100/80,  heart rate 80 and regular, respirations 18.  NECK:  No jugular venous distention, no carotid bruits.  LUNGS:  Clear.  CARDIAC:  Normal first and second heart sounds.  Fourth heart sound  present.  ABDOMEN:  Soft and nontender; no bruits; aortic pulsation not palpable.  EXTREMITIES:  Somewhat decreased pulses on the left, particularly the  dorsalis pedis.  No edema.   Lipid profile from last year was quite good with total cholesterol of  151, HDL of 40 and LDL of 77.  Chemistry profile was normal.   IMPRESSION:  Mr. Romanoski is doing very well.  He may benefit from a  slightly lower LDL cholesterol.  We will increase Atorvastatin to 40 mg  daily with his next prescription.  Otherwise, he is doing quite well  with his current regimen.  We will repeat a chemistry profile and lipid  profile in three months and plan to see this nice gentleman again in one  year.   ADDENDUM  Mr. Milstein has a trigger finger and requests referral to a hand surgeon.  We will attempt to find an appropriate surgeon who can effectively  repair this problem.    Sincerely,      Gerrit Friends. Dietrich Pates, MD,  [facility]  Electronically Signed    RMR/MedQ  DD: 05/16/2007  DT: 05/16/2007  Job #: [id number]

## 2011-01-26 ENCOUNTER — Other Ambulatory Visit: Payer: Self-pay | Admitting: Cardiology

## 2011-01-28 NOTE — Letter (Signed)
April 27, 2006     Phillip Preston, M.D.  [address]  [place], [place]  [address]   RE:  DJIMON, LUNDSTROM  MRN:  [id number]  /  DOB:  10-Apr-1946   Dear Thalia Party:   Mr. Lemmons returns to the office for continued assessment and treatment of  coronary disease. It has been 11 years since his myocardial infarction.  Despite moderate LAD and circumflex disease as well as critical RCA disease,  he has done extraordinarily well. He continues to work [employer]. He  denies all cardiopulmonary symptoms.  He discontinued cigarette smoking in  1995.  He is extremely reliable with his medical regimen which includes  Toprol 100 mg daily, aspirin 81 mg daily, ramipril 10 mg daily,  chlorthalidone 12.5 mg daily, atorvastatin 20 mg daily. He was taking fish  oil b.i.d., but could not tolerate it, so he is taking it once a day.   On exam, a trim, pleasant gentleman in no acute distress. The weight is 218,  1 pound more than last year. Blood pressure 120/85, heart rate 75 and  regular, respirations 16.  Neck: No jugular venous distension; normal  carotid upstrokes without bruits. Lungs:  Clear.  Cardiac:  Normal first and  second heart sounds; normal PMI.  Abdomen:  Soft and nontender; no masses;  aortic pulsation not palpable.  Extremities:  No edema; normal distal  pulses.   Recent laboratories good with a total cholesterol of 151, HDL of 40 and LDL  of 77.  Chemistry profile is normal.        IMPRESSION:  Mr. Pellot is doing extremely well. He is reminded to receive  influenza vaccine in your office this year. I will see him again in 12  months.   Sincerely,      Gerrit Friends. Dietrich Pates, MD, [facility]   RMR/MedQ  DD:  04/27/2006  DT:  04/27/2006  Job #:  [id number]

## 2011-03-31 ENCOUNTER — Other Ambulatory Visit: Payer: Self-pay | Admitting: Cardiology

## 2011-04-09 ENCOUNTER — Other Ambulatory Visit: Payer: Self-pay | Admitting: Cardiology

## 2011-06-06 ENCOUNTER — Other Ambulatory Visit: Payer: Self-pay | Admitting: Cardiology

## 2011-08-11 ENCOUNTER — Encounter: Payer: Self-pay | Admitting: Cardiology

## 2011-11-09 ENCOUNTER — Other Ambulatory Visit: Payer: Self-pay | Admitting: Cardiology

## 2011-12-09 ENCOUNTER — Other Ambulatory Visit: Payer: Self-pay | Admitting: Cardiology

## 2011-12-29 ENCOUNTER — Encounter: Payer: Self-pay | Admitting: Adult Health

## 2011-12-29 ENCOUNTER — Ambulatory Visit (INDEPENDENT_AMBULATORY_CARE_PROVIDER_SITE_OTHER): Payer: BC Managed Care – PPO | Admitting: Adult Health

## 2011-12-29 ENCOUNTER — Ambulatory Visit: Payer: BC Managed Care – PPO | Admitting: Cardiology

## 2011-12-29 DIAGNOSIS — I253 Aneurysm of heart: Secondary | ICD-10-CM

## 2011-12-29 DIAGNOSIS — R5383 Other fatigue: Secondary | ICD-10-CM

## 2011-12-29 DIAGNOSIS — I2510 Atherosclerotic heart disease of native coronary artery without angina pectoris: Secondary | ICD-10-CM

## 2011-12-29 DIAGNOSIS — E782 Mixed hyperlipidemia: Secondary | ICD-10-CM

## 2011-12-29 DIAGNOSIS — I10 Essential (primary) hypertension: Secondary | ICD-10-CM

## 2011-12-29 DIAGNOSIS — I739 Peripheral vascular disease, unspecified: Secondary | ICD-10-CM

## 2011-12-29 DIAGNOSIS — Z7982 Long term (current) use of aspirin: Secondary | ICD-10-CM

## 2011-12-29 DIAGNOSIS — R5381 Other malaise: Secondary | ICD-10-CM

## 2011-12-29 MED ORDER — METOPROLOL SUCCINATE ER 50 MG PO TB24: 50.0000 mg | ORAL_TABLET | Freq: Every day | ORAL | Status: DC

## 2011-12-29 NOTE — Patient Instructions (Signed)
**Note De-Identified [name] Obfuscation** Your physician recommends that you return for lab work in: This week (no later than 01-12-12)  Your physician has recommended you make the following change in your medication: stop taking Metoprolol (Lopressor) and start taking Metoprolol (Toprol) 50 mg at bedtime.  Your physician recommends that you schedule a follow-up appointment in: 1 year

## 2011-12-29 NOTE — Assessment & Plan Note (Signed)
As above.

## 2011-12-29 NOTE — Progress Notes (Signed)
   HPI: Mr. Furio is a 66 y/o patient of Dr. Dietrich Pates we are seeing for ongoing assessment and treatment of CAD, hypertension, hypercholesterolemia.  He comes today for annual appointment. He has no complaints of chest pain, DOE, lower extremity pain. He admits to chronic fatigue and mild depression which he is attributing to his medications. He is active and likes being outdoors. He has had some facial skin cancers removed recently, but has not had any hospitalizations or new illnesses since last being seen.   No Known Allergies  Current Outpatient Prescriptions  Medication Sig Dispense Refill  . aspirin 81 MG tablet Take 81 mg by mouth daily.      . chlorthalidone (HYGROTON) 25 MG tablet TAKE 1 TABLET ONCE DAILY  30 tablet  3  . fish oil-omega-3 fatty acids 1000 MG capsule Take 1 capsule by mouth 2 (two) times daily.        Marland Kitchen LIPITOR 40 MG tablet TAKE 1 TABLET ONCE DAILY  30 each  9  . omeprazole (PRILOSEC) 20 MG capsule Take 20 mg by mouth 2 (two) times daily.       . ramipril (ALTACE) 10 MG capsule TAKE ONE CAPSULE ONCE DAILY  30 capsule  1  . metoprolol succinate (TOPROL-XL) 50 MG 24 hr tablet Take 1 tablet (50 mg total) by mouth at bedtime. Take with or immediately following a meal.  30 tablet  11    Past Medical History  Diagnosis Date  . ASCVD (arteriosclerotic cardiovascular disease)   . Hypertension   . Hyperlipidemia   . Tobacco abuse   . Peripheral vascular disease   . Contracted, joint     Right fifth finger  . GERD (gastroesophageal reflux disease)   . Colonic polyp     Past Surgical History  Procedure Date  . Appendectomy   . Colonoscopy w/ polypectomy     ZOX:[id number] of systems complete and found to be negative unless listed above  PHYSICAL EXAM BP 146/94  Pulse 61  Ht 5\' 11"  (1.803 m)  Wt 230 lb (104.327 kg)  BMI 32.08 kg/m2 General: Well developed, well nourished, in no acute distress Head: Eyes PERRLA, No xanthomas.   Normal cephalic and  atramatic Lungs: Clear bilaterally to auscultation and percussion. Heart: HRRR S1 S2, without MRG.  Pulses are 2+ & equal.            No carotid bruit. No JVD.  No abdominal bruits. No femoral bruits. Abdomen: Bowel sounds are positive, abdomen soft and non-tender without masses or                  Hernia's noted. Msk:  Back normal, normal gait. Normal strength and tone for age. Extremities: No clubbing, cyanosis or edema.  DP +1 Neuro: Alert and oriented X 3. Psych:  Good affect, responds appropriately  EKG: NSR with rate of 61 bpm with nonspecific T-wave abnormality.:  ASSESSMENT AND PLAN

## 2011-12-29 NOTE — Assessment & Plan Note (Signed)
He is without cardiac complaint. Since he is experiencing some fatigue and mild depression, I will change his metoprolol 50 mg BID to Toprol XL 50 mg to be taken at HS. Labs will be completed to include fasting lipids and LFT's CBC, BMET , TSH, and Testosterone level. We will see him again in one year unless he is symptomatic, having critical lab values, or intolerant of his medication changes.

## 2012-01-02 ENCOUNTER — Other Ambulatory Visit: Payer: Self-pay | Admitting: Adult Health

## 2012-01-02 LAB — HEPATIC FUNCTION PANEL
ALT: 32 U/L (ref 0–53)
AST: 25 U/L (ref 0–37)
Albumin: 4.5 g/dL (ref 3.5–5.2)
Alkaline Phosphatase: 44 U/L (ref 39–117)
Bilirubin, Direct: 0.4 mg/dL — ABNORMAL HIGH (ref 0.0–0.3)
Indirect Bilirubin: 1.5 mg/dL — ABNORMAL HIGH (ref 0.0–0.9)
Total Bilirubin: 1.9 mg/dL — ABNORMAL HIGH (ref 0.3–1.2)
Total Protein: 7 g/dL (ref 6.0–8.3)

## 2012-01-02 LAB — CBC WITH DIFFERENTIAL/PLATELET
Eosinophils Absolute: 0.2 10*3/uL (ref 0.0–0.7)
Eosinophils Relative: 3 % (ref 0–5)
HCT: 44.1 % (ref 39.0–52.0)
Lymphocytes Relative: 34 % (ref 12–46)
Lymphs Abs: 2.3 10*3/uL (ref 0.7–4.0)
MCH: 29.9 pg (ref 26.0–34.0)
MCV: 90.4 fL (ref 78.0–100.0)
Monocytes Absolute: 0.6 10*3/uL (ref 0.1–1.0)
Platelets: 230 10*3/uL (ref 150–400)
RBC: 4.88 MIL/uL (ref 4.22–5.81)
RDW: 13.9 % (ref 11.5–15.5)
WBC: 6.9 10*3/uL (ref 4.0–10.5)

## 2012-01-02 LAB — LIPID PANEL
Cholesterol: 123 mg/dL (ref 0–200)
HDL: 39 mg/dL — ABNORMAL LOW
LDL Cholesterol: 50 mg/dL (ref 0–99)
Total CHOL/HDL Ratio: 3.2 ratio
Triglycerides: 172 mg/dL — ABNORMAL HIGH
VLDL: 34 mg/dL (ref 0–40)

## 2012-01-02 LAB — TSH: TSH: 2.393 u[IU]/mL (ref 0.350–4.500)

## 2012-01-02 LAB — BASIC METABOLIC PANEL WITH GFR
BUN: 12 mg/dL (ref 6–23)
CO2: 33 meq/L — ABNORMAL HIGH (ref 19–32)
Calcium: 9.6 mg/dL (ref 8.4–10.5)
Chloride: 98 meq/L (ref 96–112)
Creat: 0.97 mg/dL (ref 0.50–1.35)
Glucose, Bld: 105 mg/dL — ABNORMAL HIGH (ref 70–99)
Potassium: 4.1 meq/L (ref 3.5–5.3)
Sodium: 140 meq/L (ref 135–145)

## 2012-01-03 ENCOUNTER — Other Ambulatory Visit: Payer: Self-pay

## 2012-01-03 LAB — TESTOSTERONE, FREE, TOTAL, SHBG
Sex Hormone Binding: 46 nmol/L (ref 13–71)
Testosterone, Free: 84.1 pg/mL (ref 47.0–244.0)
Testosterone-% Free: 1.7 % (ref 1.6–2.9)

## 2012-01-03 MED ORDER — ATORVASTATIN CALCIUM 20 MG PO TABS: 20.0000 mg | ORAL_TABLET | Freq: Every day | ORAL | Status: DC

## 2012-01-24 ENCOUNTER — Telehealth: Payer: Self-pay | Admitting: Adult Health

## 2012-01-24 NOTE — Telephone Encounter (Signed)
Would like results of his testosterone level. / tg

## 2012-01-24 NOTE — Telephone Encounter (Signed)
**Note De-identified Devlyn Parish Obfuscation** Pt's wife advised, she verbalized understanding./LV 

## 2012-02-16 ENCOUNTER — Other Ambulatory Visit: Payer: Self-pay | Admitting: Cardiology

## 2012-07-23 ENCOUNTER — Other Ambulatory Visit: Payer: Self-pay | Admitting: Adult Health

## 2012-10-24 ENCOUNTER — Other Ambulatory Visit: Payer: Self-pay | Admitting: Cardiology

## 2012-12-28 ENCOUNTER — Ambulatory Visit (INDEPENDENT_AMBULATORY_CARE_PROVIDER_SITE_OTHER): Payer: Medicare Other | Admitting: Cardiology

## 2012-12-28 ENCOUNTER — Encounter: Payer: Self-pay | Admitting: Cardiology

## 2012-12-28 VITALS — BP 122/82 | HR 90 | Ht 71.0 in | Wt 230.1 lb

## 2012-12-28 DIAGNOSIS — I10 Essential (primary) hypertension: Secondary | ICD-10-CM

## 2012-12-28 DIAGNOSIS — F17201 Nicotine dependence, unspecified, in remission: Secondary | ICD-10-CM | POA: Insufficient documentation

## 2012-12-28 DIAGNOSIS — I2510 Atherosclerotic heart disease of native coronary artery without angina pectoris: Secondary | ICD-10-CM

## 2012-12-28 DIAGNOSIS — I739 Peripheral vascular disease, unspecified: Secondary | ICD-10-CM

## 2012-12-28 DIAGNOSIS — I7090 Unspecified atherosclerosis: Secondary | ICD-10-CM

## 2012-12-28 DIAGNOSIS — Z87891 Personal history of nicotine dependence: Secondary | ICD-10-CM

## 2012-12-28 DIAGNOSIS — E785 Hyperlipidemia, unspecified: Secondary | ICD-10-CM

## 2012-12-28 NOTE — Progress Notes (Deleted)
Name: Phillip Preston    DOB: July 19, 1946  Age: 67 y.o.  MR#: [id number]       PCP:  Alice Reichert, MD      Insurance: Payor: [employer] [place] [employer]  Plan: BLUE MEDICARE  Product Type: *No Product type*    CC:   No chief complaint on file.         Medication list  VS Filed Vitals:   12/28/12 1309  BP: 122/82  Pulse: 90  Height: 5\' 11"  (1.803 m)  Weight: 230 lb 1.9 oz (104.382 kg)    Weights Current Weight  12/28/12 230 lb 1.9 oz (104.382 kg)  12/29/11 230 lb (104.327 kg)  10/25/10 233 lb (105.688 kg)    Blood Pressure  BP Readings from Last 3 Encounters:  12/28/12 122/82  12/29/11 146/94  10/25/10 145/89     Admit date:  (Not on file) Last encounter with RMR:  10/24/2012   Allergy Review of patient's allergies indicates no known allergies.  Current Outpatient Prescriptions  Medication Sig Dispense Refill  . aspirin 81 MG tablet Take 81 mg by mouth daily.      Marland Kitchen atorvastatin (LIPITOR) 20 MG tablet TAKE ONE TABLET BY MOUTH ONCE DAILY.  30 tablet  6  . chlorthalidone (HYGROTON) 25 MG tablet TAKE 1 TABLET ONCE DAILY  30 tablet  6  . fish oil-omega-3 fatty acids 1000 MG capsule Take 1 capsule by mouth 2 (two) times daily.        . metoprolol succinate (TOPROL-XL) 50 MG 24 hr tablet Take 1 tablet (50 mg total) by mouth at bedtime. Take with or immediately following a meal.  30 tablet  11  . omeprazole (PRILOSEC) 20 MG capsule Take 20 mg by mouth 2 (two) times daily.       . ramipril (ALTACE) 10 MG capsule TAKE ONE CAPSULE ONCE DAILY  30 capsule  6   No current facility-administered medications for this visit.    Discontinued Meds:   There are no discontinued medications.  Patient Active Problem List  Diagnosis  . ATHEROSCLEROTIC CARDIOVASCULAR DISEASE  . PERIPHERAL VASCULAR DISEASE    LABS    Component Value Date/Time   NA 140 12/29/2011 1226   NA 141 11/01/2010 1728   NA 141 11/16/2009 2015   K 4.1 12/29/2011 1226   K 3.9 11/01/2010 1728   K 4.0 11/16/2009  2015   CL 98 12/29/2011 1226   CL 98 11/01/2010 1728   CL 99 11/16/2009 2015   CO2 33* 12/29/2011 1226   CO2 31 11/01/2010 1728   CO2 29 11/16/2009 2015   GLUCOSE 105* 12/29/2011 1226   GLUCOSE 106* 11/01/2010 1728   GLUCOSE 89 11/16/2009 2015   BUN 12 12/29/2011 1226   BUN 15 11/01/2010 1728   BUN 10 11/16/2009 2015   CREATININE 0.97 12/29/2011 1226   CREATININE 1.22 11/01/2010 1728   CREATININE 1.04 11/16/2009 2015   CREATININE 1.04 11/16/2009   CALCIUM 9.6 12/29/2011 1226   CALCIUM 9.7 11/01/2010 1728   CALCIUM 9.1 11/16/2009 2015   CMP     Component Value Date/Time   NA 140 12/29/2011 1226   K 4.1 12/29/2011 1226   CL 98 12/29/2011 1226   CO2 33* 12/29/2011 1226   GLUCOSE 105* 12/29/2011 1226   BUN 12 12/29/2011 1226   CREATININE 0.97 12/29/2011 1226   CREATININE 1.22 11/01/2010 1728   CALCIUM 9.6 12/29/2011 1226   PROT 7.0 12/29/2011 1226   ALBUMIN  4.5 12/29/2011 1226   AST 25 12/29/2011 1226   ALT 32 12/29/2011 1226   ALKPHOS 44 12/29/2011 1226   BILITOT 1.9* 12/29/2011 1226       Component Value Date/Time   WBC 6.9 12/29/2011 1226   WBC 5.9 11/16/2009 2015   WBC 5.9 11/16/2009   HGB 14.6 12/29/2011 1226   HGB 14.0 11/16/2009 2015   HGB 14.0 11/16/2009   HCT 44.1 12/29/2011 1226   HCT 42.7 11/16/2009 2015   HCT 42.7 11/16/2009   MCV 90.4 12/29/2011 1226   MCV 90.5 11/16/2009 2015   MCV 90.5 11/16/2009    Lipid Panel     Component Value Date/Time   CHOL 123 12/29/2011 1226   TRIG 172* 12/29/2011 1226   HDL 39* 12/29/2011 1226   CHOLHDL 3.2 12/29/2011 1226   VLDL 34 12/29/2011 1226   LDLCALC 50 12/29/2011 1226    ABG No results found for this basename: phart, pco2, pco2art, po2, po2art, hco3, tco2, acidbasedef, o2sat     Lab Results  Component Value Date   TSH 2.393 12/29/2011   BNP (last 3 results) No results found for this basename: PROBNP,  in the last 8760 hours Cardiac Panel (last 3 results) No results found for this basename: CKTOTAL, CKMB, TROPONINI, RELINDX,  in the last 72 hours   Iron/TIBC/Ferritin No results found for this basename: iron, tibc, ferritin     EKG Orders placed in visit on 12/29/11  . EKG 12-LEAD     Prior Assessment and Plan Problem List as of 12/28/2012     ICD-9-CM     Cardiology Problems   ATHEROSCLEROTIC CARDIOVASCULAR DISEASE   Last Assessment & Plan   12/29/2011 Office Visit Written 12/29/2011 12:59 PM by Jodelle Gross, NP     As above.    PERIPHERAL VASCULAR DISEASE   Last Assessment & Plan   12/29/2011 Office Visit Written 12/29/2011 12:59 PM by Jodelle Gross, NP     He is without cardiac complaint. Since he is experiencing some fatigue and mild depression, I will change his metoprolol 50 mg BID to Toprol XL 50 mg to be taken at HS. Labs will be completed to include fasting lipids and LFT's CBC, BMET , TSH, and Testosterone level. We will see him again in one year unless he is symptomatic, having critical lab values, or intolerant of his medication changes.        Imaging: No results found.

## 2012-12-28 NOTE — Patient Instructions (Addendum)
Your physician recommends that you return for lab work next week for fasting cholesterol level, cmp, & cbc We will call you with your results   Your physician recommends that you schedule a follow-up appointment in: 1 year

## 2012-12-28 NOTE — Progress Notes (Signed)
Patient ID: Phillip Preston, male   DOB: 1946-06-26, 67 y.o.   MRN: [id number]  HPI: Schedule return visit for this very pleasant gentleman with a remote history of coronary artery disease, multiple cardiovascular risk factors but no symptoms despite a reasonably high level of activity.  Current Outpatient Prescriptions  Medication Sig Dispense Refill  . aspirin 81 MG tablet Take 81 mg by mouth daily.      Marland Kitchen atorvastatin (LIPITOR) 20 MG tablet TAKE ONE TABLET BY MOUTH ONCE DAILY.  30 tablet  6  . chlorthalidone (HYGROTON) 25 MG tablet TAKE 1 TABLET ONCE DAILY  30 tablet  6  . fish oil-omega-3 fatty acids 1000 MG capsule Take 1 capsule by mouth 2 (two) times daily.        . metoprolol succinate (TOPROL-XL) 50 MG 24 hr tablet Take 1 tablet (50 mg total) by mouth at bedtime. Take with or immediately following a meal.  30 tablet  11  . omeprazole (PRILOSEC) 20 MG capsule Take 20 mg by mouth 2 (two) times daily.       . ramipril (ALTACE) 10 MG capsule TAKE ONE CAPSULE ONCE DAILY  30 capsule  6   No current facility-administered medications for this visit.   No Known Allergies    Past medical history, social history, and family history reviewed and updated.  ROS: Denies chest pain, dyspnea, orthopnea, peripheral edema, palpitations, lightheadedness or syncope. He's had 2 skin cancers removed during the past 12 months and will require cataract surgery. All other systems reviewed and are negative.  PHYSICAL EXAM: BP 122/82  Pulse 90  Ht 5\' 11"  (1.803 m)  Wt 104.382 kg (230 lb 1.9 oz)  BMI 32.11 kg/m2;  Body mass index is 32.11 kg/(m^2). General-Well developed; no acute distress Body habitus-mildly overweight Neck-No JVD; no carotid bruits Lungs-clear lung fields; resonant to percussion Cardiovascular-normal PMI; normal S1 and S2 Abdomen-normal bowel sounds; soft and non-tender without masses or organomegaly Musculoskeletal-No deformities, no cyanosis or clubbing Neurologic-Normal cranial  nerves; symmetric strength and tone Skin-Warm, no significant lesions Extremities-1+ distal pulses intact; no edema  Arcade Bing, MD 12/28/2012  1:34 PM  ASSESSMENT AND PLAN

## 2012-12-29 ENCOUNTER — Encounter: Payer: Self-pay | Admitting: Cardiology

## 2012-12-29 DIAGNOSIS — I739 Peripheral vascular disease, unspecified: Secondary | ICD-10-CM | POA: Insufficient documentation

## 2012-12-29 DIAGNOSIS — I2510 Atherosclerotic heart disease of native coronary artery without angina pectoris: Secondary | ICD-10-CM | POA: Insufficient documentation

## 2012-12-29 NOTE — Assessment & Plan Note (Signed)
Blood pressure control excellent; continue current medication.

## 2012-12-29 NOTE — Assessment & Plan Note (Signed)
Remote intervention for acute myocardial infarction without subsequent manifestations of coronary disease over the past 18 years. Patient is highly motivated and cooperative with efforts to control cardiovascular risk factors, which will be continued.

## 2012-12-29 NOTE — Assessment & Plan Note (Signed)
Excellent control of lipids when last assessed one year ago. Dose of atorvastatin was subsequently reduced. Lipid profile will be repeated.

## 2013-01-02 ENCOUNTER — Other Ambulatory Visit: Payer: Self-pay | Admitting: Adult Health

## 2013-01-03 LAB — CBC WITH DIFFERENTIAL/PLATELET
Basophils Absolute: 0.1 10*3/uL (ref 0.0–0.1)
Lymphocytes Relative: 32 % (ref 12–46)
Lymphs Abs: 2 10*3/uL (ref 0.7–4.0)
MCV: 85.7 fL (ref 78.0–100.0)
Neutro Abs: 3.7 10*3/uL (ref 1.7–7.7)
Neutrophils Relative %: 58 % (ref 43–77)
Platelets: 213 10*3/uL (ref 150–400)
RBC: 4.74 MIL/uL (ref 4.22–5.81)
RDW: 14.5 % (ref 11.5–15.5)
WBC: 6.3 10*3/uL (ref 4.0–10.5)

## 2013-01-03 LAB — HEPATIC FUNCTION PANEL
ALT: 24 U/L (ref 0–53)
Bilirubin, Direct: 0.2 mg/dL (ref 0.0–0.3)
Indirect Bilirubin: 1 mg/dL — ABNORMAL HIGH (ref 0.0–0.9)
Total Bilirubin: 1.2 mg/dL (ref 0.3–1.2)

## 2013-01-03 LAB — LIPID PANEL
Cholesterol: 128 mg/dL (ref 0–200)
HDL: 38 mg/dL — ABNORMAL LOW (ref >39)
LDL Cholesterol: 48 mg/dL (ref 0–99)
Total CHOL/HDL Ratio: 3.4 Ratio
Triglycerides: 211 mg/dL — ABNORMAL HIGH (ref <150)
VLDL: 42 mg/dL — ABNORMAL HIGH (ref 0–40)

## 2013-01-03 LAB — BASIC METABOLIC PANEL
BUN: 9 mg/dL (ref 6–23)
Calcium: 9.2 mg/dL (ref 8.4–10.5)
Chloride: 101 mEq/L (ref 96–112)
Creat: 1.18 mg/dL (ref 0.50–1.35)

## 2013-01-03 LAB — TSH: TSH: 2.635 u[IU]/mL (ref 0.350–4.500)

## 2013-01-04 ENCOUNTER — Other Ambulatory Visit: Payer: Self-pay | Admitting: Adult Health

## 2013-01-04 NOTE — Telephone Encounter (Signed)
rx sent to pharmacy by e-script  

## 2013-01-15 ENCOUNTER — Encounter: Payer: Self-pay | Admitting: *Deleted

## 2013-03-07 ENCOUNTER — Other Ambulatory Visit: Payer: Self-pay | Admitting: Adult Health

## 2013-04-30 ENCOUNTER — Other Ambulatory Visit: Payer: Self-pay | Admitting: Cardiology

## 2013-07-08 ENCOUNTER — Other Ambulatory Visit: Payer: Self-pay | Admitting: Cardiology

## 2013-08-07 ENCOUNTER — Other Ambulatory Visit: Payer: Self-pay | Admitting: Cardiology

## 2013-10-11 ENCOUNTER — Other Ambulatory Visit: Payer: Self-pay | Admitting: Adult Health

## 2013-12-18 ENCOUNTER — Ambulatory Visit (INDEPENDENT_AMBULATORY_CARE_PROVIDER_SITE_OTHER): Payer: Medicare Other | Admitting: Physician Assistant

## 2013-12-18 ENCOUNTER — Encounter: Payer: Self-pay | Admitting: Physician Assistant

## 2013-12-18 ENCOUNTER — Ambulatory Visit: Payer: Medicare Other | Admitting: Cardiology

## 2013-12-18 VITALS — BP 151/87 | HR 88 | Ht 71.0 in | Wt 230.0 lb

## 2013-12-18 DIAGNOSIS — I10 Essential (primary) hypertension: Secondary | ICD-10-CM

## 2013-12-18 DIAGNOSIS — I2510 Atherosclerotic heart disease of native coronary artery without angina pectoris: Secondary | ICD-10-CM

## 2013-12-18 DIAGNOSIS — E785 Hyperlipidemia, unspecified: Secondary | ICD-10-CM

## 2013-12-18 DIAGNOSIS — I7090 Unspecified atherosclerosis: Secondary | ICD-10-CM

## 2013-12-18 DIAGNOSIS — Z87891 Personal history of nicotine dependence: Secondary | ICD-10-CM

## 2013-12-18 DIAGNOSIS — F17201 Nicotine dependence, unspecified, in remission: Secondary | ICD-10-CM

## 2013-12-18 MED ORDER — CHLORTHALIDONE 25 MG PO TABS: ORAL_TABLET | ORAL | Status: DC

## 2013-12-18 MED ORDER — RAMIPRIL 10 MG PO CAPS: ORAL_CAPSULE | ORAL | Status: DC

## 2013-12-18 MED ORDER — METOPROLOL SUCCINATE ER 50 MG PO TB24: ORAL_TABLET | ORAL | Status: DC

## 2013-12-18 MED ORDER — OMEPRAZOLE 20 MG PO CPDR: 20.0000 mg | DELAYED_RELEASE_CAPSULE | Freq: Two times a day (BID) | ORAL | Status: DC

## 2013-12-18 MED ORDER — ATORVASTATIN CALCIUM 20 MG PO TABS: ORAL_TABLET | ORAL | Status: DC

## 2013-12-18 NOTE — Assessment & Plan Note (Signed)
Blood pressure was elevated when he first came in but is stable on repeat check. Patient checks his blood pressure regularly at home and it's usually 127-130/70. No changes.

## 2013-12-18 NOTE — Assessment & Plan Note (Signed)
Check fasting lipid panel 

## 2013-12-18 NOTE — Assessment & Plan Note (Signed)
Status post MI treated with stenting of the RCA in 1996. Residual CAD as listed above. Patient is asymptomatic without chest pain. Continue current treatment.

## 2013-12-18 NOTE — Progress Notes (Signed)
HPI: This is a 68 year old male patient of Dr. Lattie Haw who has history of coronary artery disease status post inferior ST elevation MI treated with PTCA and stenting of the RCA in 1996 with overall preserved LV function. He had a 50-60% proximal LAD, 30% proximal LAD, 50-60% proximal circumflex. He also has history of hypertension and hyperlipidemia.   He has had no cardiac problems since his MI. He is almost 19 years out and denies any chest pain, palpitations, dyspnea, dyspnea on exertion, dizziness, or presyncope. He walks an hour a day, goes fishing, and dances twice a week. His blood pressure is up a low today but when he checks it regularly at home is 130/70.  No Known Allergies  Current Outpatient Prescriptions on File Prior to Visit: aspirin 81 MG tablet, Take 81 mg by mouth daily., Disp: , Rfl:  fish oil-omega-3 fatty acids 1000 MG capsule, Take 1 capsule by mouth 2 (two) times daily.  , Disp: , Rfl:   No current facility-administered medications on file prior to visit.   Past Medical History:   Arteriosclerotic cardiovascular disease (ASCVD)                Comment:IMI requiring RCA stent; EF- 50%;repeat RCA               stent in 6/96 with residual 50-60% mid LAD & Cx   Hypertension                                                 Hyperlipidemia                                               Tobacco abuse, in remission                                    Comment:40 pack years; discontinued in 1996   Peripheral vascular disease                                    Comment:decreased distal pulses; asymptomatic   Trigger finger                                                 Comment:Right fifth finger   Gastroesophageal reflux disease                              Colonic polyp                                   2009         Cholelithiasis                                  2009  Comment:Asymptomatic  Past Surgical History:   APPENDECTOMY                                                   COLONOSCOPY W/ POLYPECTOMY                       2009        Review of patient's family history indicates:   Cancer                         Father                   Social History   Marital Status: Married             Spouse Name:                      Years of Education:                 Number of children:             Occupational History Occupation          Fish farm manager            Comment              Retired                                 Actor  Social History Main Topics   Smoking Status: Former Smoker                   Packs/Day: 1.30  Years: 92        Quit date: 02/16/1995   Smokeless Status: Not on file                      Alcohol Use: Yes               Comment: Moderate   Drug Use: No             Sexual Activity: Not on file        Other Topics            Concern   None on file  Social History Narrative   Married    ROS: See history of present illness otherwise negative   PHYSICAL EXAM: Well-nournished, in no acute distress. Neck: No JVD, HJR, Bruit, or thyroid enlargement  Lungs: No tachypnea, clear without wheezing, rales, or rhonchi  Cardiovascular: RRR, PMI not displaced, heart sounds normal, no murmurs, gallops, bruit, thrill, or heave.  Abdomen: BS normal. Soft without organomegaly, masses, lesions or tenderness.  Extremities: without cyanosis, clubbing or edema. Good distal pulses bilateral  SKin: Warm, no lesions or rashes   Musculoskeletal: No deformities  Neuro: no focal signs  BP 151/87  Pulse 88  Ht 5\' 11"  (1.803 m)  Wt 230 lb (104.327 kg)  BMI 32.09 kg/m2, repeat blood pressure 130/70  EKG: Normal sinus rhythm with inferior Q waves and ST T wave abnormality inferior laterally unchanged from prior tracings

## 2013-12-18 NOTE — Patient Instructions (Signed)
Your physician recommends that you schedule a follow-up appointment in: 1 year with Dr Virgina Jock will receive a reminder letter two months in advance reminding you to call and schedule your appointment. If you don't receive this letter, please contact our office.  Your physician recommends that you return for lab work this week. Fasting Lipid, CMET, CBC

## 2013-12-18 NOTE — Assessment & Plan Note (Signed)
Quit Smoking in 1996

## 2013-12-19 ENCOUNTER — Telehealth: Payer: Self-pay | Admitting: Cardiology

## 2013-12-19 ENCOUNTER — Other Ambulatory Visit: Payer: Self-pay

## 2013-12-19 LAB — LIPID PANEL
Cholesterol: 116 mg/dL (ref 0–200)
HDL: 31 mg/dL — ABNORMAL LOW (ref >39)
LDL CALC: 36 mg/dL (ref 0–99)
TRIGLYCERIDES: 246 mg/dL — AB (ref <150)
Total CHOL/HDL Ratio: 3.7 Ratio
VLDL: 49 mg/dL — AB (ref 0–40)

## 2013-12-19 LAB — COMPREHENSIVE METABOLIC PANEL
ALBUMIN: 4.1 g/dL (ref 3.5–5.2)
ALK PHOS: 51 U/L (ref 39–117)
ALT: 21 U/L (ref 0–53)
AST: 15 U/L (ref 0–37)
BUN: 10 mg/dL (ref 6–23)
CO2: 33 mEq/L — ABNORMAL HIGH (ref 19–32)
Calcium: 9.5 mg/dL (ref 8.4–10.5)
Chloride: 100 mEq/L (ref 96–112)
Creat: 1.13 mg/dL (ref 0.50–1.35)
GLUCOSE: 95 mg/dL (ref 70–99)
POTASSIUM: 4.6 meq/L (ref 3.5–5.3)
SODIUM: 142 meq/L (ref 135–145)
TOTAL PROTEIN: 6.7 g/dL (ref 6.0–8.3)
Total Bilirubin: 1.7 mg/dL — ABNORMAL HIGH (ref 0.2–1.2)

## 2013-12-19 LAB — CBC
HCT: 42.3 % (ref 39.0–52.0)
HEMOGLOBIN: 14.2 g/dL (ref 13.0–17.0)
MCH: 29 pg (ref 26.0–34.0)
MCHC: 33.6 g/dL (ref 30.0–36.0)
MCV: 86.5 fL (ref 78.0–100.0)
Platelets: 280 10*3/uL (ref 150–400)
RBC: 4.89 MIL/uL (ref 4.22–5.81)
RDW: 14 % (ref 11.5–15.5)
WBC: 7.3 10*3/uL (ref 4.0–10.5)

## 2013-12-19 MED ORDER — OMEPRAZOLE 40 MG PO CPDR: 40.0000 mg | DELAYED_RELEASE_CAPSULE | Freq: Every day | ORAL | Status: DC

## 2013-12-19 NOTE — Telephone Encounter (Signed)
Medication sent via escribe.  

## 2013-12-19 NOTE — Telephone Encounter (Signed)
Please see paper in refill bin / tgs  °

## 2013-12-19 NOTE — Addendum Note (Signed)
Addended by: Truett Mainland on: 12/19/2013 02:21 PM   Modules accepted: Orders

## 2013-12-23 ENCOUNTER — Telehealth: Payer: Self-pay | Admitting: *Deleted

## 2013-12-23 ENCOUNTER — Encounter: Payer: Self-pay | Admitting: *Deleted

## 2013-12-23 DIAGNOSIS — E782 Mixed hyperlipidemia: Secondary | ICD-10-CM

## 2013-12-23 NOTE — Telephone Encounter (Signed)
Message copied by Truett Mainland on Mon Dec 23, 2013 10:13 AM ------      Message from: Hytop F      Created: Mon Dec 23, 2013 10:04 AM       Please let patient know that her triglcyerides are elevated. Needs to work on cutting back on fatty foods and sweets, increase exercise and weight loss. Please order a HgbA1C as well.                  Carlyle Dolly  MD ------

## 2013-12-23 NOTE — Telephone Encounter (Signed)
Letter mailed to patient with results.

## 2013-12-28 LAB — HEMOGLOBIN A1C
Hgb A1c MFr Bld: 5.8 % — ABNORMAL HIGH (ref <5.7)
Mean Plasma Glucose: 120 mg/dL — ABNORMAL HIGH (ref <117)

## 2014-02-06 ENCOUNTER — Encounter (HOSPITAL_COMMUNITY): Payer: Self-pay | Admitting: Pharmacy Technician

## 2014-02-10 ENCOUNTER — Encounter (HOSPITAL_COMMUNITY)
Admission: RE | Admit: 2014-02-10 | Discharge: 2014-02-10 | Disposition: A | Discharge status: Home / Self Care | Payer: Medicare Other | Source: Ambulatory Visit | Attending: Ophthalmology | Admitting: Ophthalmology

## 2014-02-10 ENCOUNTER — Encounter (HOSPITAL_COMMUNITY): Payer: Self-pay

## 2014-02-10 DIAGNOSIS — Z01812 Encounter for preprocedural laboratory examination: Secondary | ICD-10-CM | POA: Insufficient documentation

## 2014-02-10 HISTORY — DX: Malignant (primary) neoplasm, unspecified: C80.1

## 2014-02-10 HISTORY — DX: Unspecified hearing loss, unspecified ear: H91.90

## 2014-02-10 HISTORY — DX: Acute myocardial infarction, unspecified: I21.9

## 2014-02-10 LAB — BASIC METABOLIC PANEL
BUN: 17 mg/dL (ref 6–23)
CO2: 32 mEq/L (ref 19–32)
Calcium: 9.7 mg/dL (ref 8.4–10.5)
Chloride: 98 mEq/L (ref 96–112)
Creatinine, Ser: 1.2 mg/dL (ref 0.50–1.35)
GFR calc Af Amer: 70 mL/min — ABNORMAL LOW (ref >90)
GFR, EST NON AFRICAN AMERICAN: 60 mL/min — AB (ref >90)
Glucose, Bld: 106 mg/dL — ABNORMAL HIGH (ref 70–99)
POTASSIUM: 4.3 meq/L (ref 3.7–5.3)
Sodium: 143 mEq/L (ref 137–147)

## 2014-02-10 LAB — HEMOGLOBIN AND HEMATOCRIT, BLOOD
HEMATOCRIT: 42.4 % (ref 39.0–52.0)
Hemoglobin: 14.4 g/dL (ref 13.0–17.0)

## 2014-02-10 NOTE — Patient Instructions (Signed)
Your procedure is scheduled on: 02/17/2014  Report to [facility] at  72  AM.  Call this number if you have problems the morning of surgery: [phone]   Do not eat food or drink liquids :After Midnight.      Take these medicines the morning of surgery with A SIP OF WATER: metoprolol, prilosec, Altace   Do not wear jewelry, make-up or nail polish.  Do not wear lotions, powders, or perfumes.   Do not shave 48 hours prior to surgery.  Do not bring valuables to the hospital.  Contacts, dentures or bridgework may not be worn into surgery.  Leave suitcase in the car. After surgery it may be brought to your room.  For patients admitted to the hospital, checkout time is 11:00 AM the day of discharge.   Patients discharged the day of surgery will not be allowed to drive home.  :     Please read over the following fact sheets that you were given: Coughing and Deep Breathing, Surgical Site Infection Prevention, Anesthesia Post-op Instructions and Care and Recovery After Surgery    Cataract A cataract is a clouding of the lens of the eye. When a lens becomes cloudy, vision is reduced based on the degree and nature of the clouding. Many cataracts reduce vision to some degree. Some cataracts make people more near-sighted as they develop. Other cataracts increase glare. Cataracts that are ignored and become worse can sometimes look white. The white color can be seen through the pupil. CAUSES   Aging. However, cataracts may occur at any age, even in newborns.   Certain drugs.   Trauma to the eye.   Certain diseases such as diabetes.   Specific eye diseases such as chronic inflammation inside the eye or a sudden attack of a rare form of glaucoma.   Inherited or acquired medical problems.  SYMPTOMS   Gradual, progressive drop in vision in the affected eye.   Severe, rapid visual loss. This most often happens when trauma is the cause.  DIAGNOSIS  To detect a cataract, an eye doctor examines  the lens. Cataracts are best diagnosed with an exam of the eyes with the pupils enlarged (dilated) by drops.  TREATMENT  For an early cataract, vision may improve by using different eyeglasses or stronger lighting. If that does not help your vision, surgery is the only effective treatment. A cataract needs to be surgically removed when vision loss interferes with your everyday activities, such as driving, reading, or watching TV. A cataract may also have to be removed if it prevents examination or treatment of another eye problem. Surgery removes the cloudy lens and usually replaces it with a substitute lens (intraocular lens, IOL).  At a time when both you and your doctor agree, the cataract will be surgically removed. If you have cataracts in both eyes, only one is usually removed at a time. This allows the operated eye to heal and be out of danger from any possible problems after surgery (such as infection or poor wound healing). In rare cases, a cataract may be doing damage to your eye. In these cases, your caregiver may advise surgical removal right away. The vast majority of people who have cataract surgery have better vision afterward. HOME CARE INSTRUCTIONS  If you are not planning surgery, you may be asked to do the following:  Use different eyeglasses.   Use stronger or brighter lighting.   Ask your eye doctor about reducing your medicine dose or changing  medicines if it is thought that a medicine caused your cataract. Changing medicines does not make the cataract go away on its own.   Become familiar with your surroundings. Poor vision can lead to injury. Avoid bumping into things on the affected side. You are at a higher risk for tripping or falling.   Exercise extreme care when driving or operating machinery.   Wear sunglasses if you are sensitive to bright light or experiencing problems with glare.  SEEK IMMEDIATE MEDICAL CARE IF:   You have a worsening or sudden vision loss.    You notice redness, swelling, or increasing pain in the eye.   You have a fever.  Document Released: 08/29/2005 Document Revised: 08/18/2011 Document Reviewed: 04/22/2011 [facility] Patient Information 2012 [place].PATIENT INSTRUCTIONS POST-ANESTHESIA  IMMEDIATELY FOLLOWING SURGERY:  Do not drive or operate machinery for the first twenty four hours after surgery.  Do not make any important decisions for twenty four hours after surgery or while taking narcotic pain medications or sedatives.  If you develop intractable nausea and vomiting or a severe headache please notify your doctor immediately.  FOLLOW-UP:  Please make an appointment with your surgeon as instructed. You do not need to follow up with anesthesia unless specifically instructed to do so.  WOUND CARE INSTRUCTIONS (if applicable):  Keep a dry clean dressing on the anesthesia/puncture wound site if there is drainage.  Once the wound has quit draining you may leave it open to air.  Generally you should leave the bandage intact for twenty four hours unless there is drainage.  If the epidural site drains for more than 36-48 hours please call the anesthesia department.  QUESTIONS?:  Please feel free to call your physician or the hospital operator if you have any questions, and they will be happy to assist you.

## 2014-02-10 NOTE — Pre-Procedure Instructions (Signed)
Patient given informationj to sign up for my chart at home.

## 2014-02-14 MED ORDER — LIDOCAINE HCL (PF) 1 % IJ SOLN
INTRAMUSCULAR | Status: AC
  Filled 2014-02-14: qty 2

## 2014-02-14 MED ORDER — LIDOCAINE HCL 3.5 % OP GEL
OPHTHALMIC | Status: AC
  Filled 2014-02-14: qty 1

## 2014-02-14 MED ORDER — NEOMYCIN-POLYMYXIN-DEXAMETH 3.5-10000-0.1 OP SUSP
OPHTHALMIC | Status: AC
  Filled 2014-02-14: qty 5

## 2014-02-14 MED ORDER — TETRACAINE HCL 0.5 % OP SOLN
OPHTHALMIC | Status: AC
Start: 2014-02-14 — End: 2014-02-14
  Filled 2014-02-14: qty 2

## 2014-02-14 MED ORDER — PHENYLEPHRINE HCL 2.5 % OP SOLN
OPHTHALMIC | Status: AC
  Filled 2014-02-14: qty 15

## 2014-02-14 MED ORDER — CYCLOPENTOLATE-PHENYLEPHRINE OP SOLN OPTIME - NO CHARGE
OPHTHALMIC | Status: AC
  Filled 2014-02-14: qty 2

## 2014-02-17 ENCOUNTER — Encounter (HOSPITAL_COMMUNITY): Payer: Medicare Other | Admitting: Anesthesiology

## 2014-02-17 ENCOUNTER — Ambulatory Visit (HOSPITAL_COMMUNITY)
Admission: RE | Admit: 2014-02-17 | Discharge: 2014-02-17 | Disposition: A | Discharge status: Home / Self Care | Payer: Medicare Other | Source: Ambulatory Visit | Attending: Ophthalmology | Admitting: Ophthalmology

## 2014-02-17 ENCOUNTER — Encounter (HOSPITAL_COMMUNITY): Payer: Self-pay

## 2014-02-17 ENCOUNTER — Encounter (HOSPITAL_COMMUNITY)
Admission: RE | Disposition: A | Discharge status: Home / Self Care | Payer: Self-pay | Source: Ambulatory Visit | Attending: Ophthalmology

## 2014-02-17 ENCOUNTER — Ambulatory Visit (HOSPITAL_COMMUNITY): Payer: Medicare Other | Admitting: Anesthesiology

## 2014-02-17 DIAGNOSIS — H2510 Age-related nuclear cataract, unspecified eye: Secondary | ICD-10-CM | POA: Insufficient documentation

## 2014-02-17 HISTORY — PX: CATARACT EXTRACTION W/PHACO: SHX586

## 2014-02-17 SURGERY — PHACOEMULSIFICATION, CATARACT, WITH IOL INSERTION
Anesthesia: Monitor Anesthesia Care | Site: Eye | Laterality: Right

## 2014-02-17 MED ORDER — ONDANSETRON HCL 4 MG/2ML IJ SOLN: 4.0000 mg | Freq: Once | INTRAMUSCULAR | Status: DC | PRN

## 2014-02-17 MED ORDER — BSS IO SOLN
INTRAOCULAR | Status: DC | PRN
  Administered 2014-02-17: 15 mL via INTRAOCULAR

## 2014-02-17 MED ORDER — POVIDONE-IODINE 5 % OP SOLN
OPHTHALMIC | Status: DC | PRN
  Administered 2014-02-17: 1 via OPHTHALMIC

## 2014-02-17 MED ORDER — LACTATED RINGERS IV SOLN
INTRAVENOUS | Status: DC
  Administered 2014-02-17: 07:00:00 via INTRAVENOUS

## 2014-02-17 MED ORDER — MIDAZOLAM HCL 2 MG/2ML IJ SOLN
INTRAMUSCULAR | Status: AC
  Filled 2014-02-17: qty 2

## 2014-02-17 MED ORDER — NEOMYCIN-POLYMYXIN-DEXAMETH 3.5-10000-0.1 OP SUSP
OPHTHALMIC | Status: DC | PRN
  Administered 2014-02-17: 2 [drp] via OPHTHALMIC

## 2014-02-17 MED ORDER — CYCLOPENTOLATE-PHENYLEPHRINE 0.2-1 % OP SOLN
1.0000 [drp] | OPHTHALMIC | Status: AC
  Administered 2014-02-17 (×3): 1 [drp] via OPHTHALMIC

## 2014-02-17 MED ORDER — TETRACAINE HCL 0.5 % OP SOLN
1.0000 [drp] | OPHTHALMIC | Status: AC
  Administered 2014-02-17 (×3): 1 [drp] via OPHTHALMIC

## 2014-02-17 MED ORDER — FENTANYL CITRATE 0.05 MG/ML IJ SOLN
INTRAMUSCULAR | Status: AC
  Filled 2014-02-17: qty 2

## 2014-02-17 MED ORDER — LIDOCAINE HCL 3.5 % OP GEL
1.0000 "application " | Freq: Once | OPHTHALMIC | Status: AC
  Administered 2014-02-17: 1 via OPHTHALMIC

## 2014-02-17 MED ORDER — PHENYLEPHRINE HCL 2.5 % OP SOLN
1.0000 [drp] | OPHTHALMIC | Status: AC
Start: 2014-02-17 — End: 2014-02-17
  Administered 2014-02-17 (×3): 1 [drp] via OPHTHALMIC

## 2014-02-17 MED ORDER — FENTANYL CITRATE 0.05 MG/ML IJ SOLN
25.0000 ug | INTRAMUSCULAR | Status: AC
  Administered 2014-02-17 (×2): 25 ug via INTRAVENOUS

## 2014-02-17 MED ORDER — EPINEPHRINE HCL 1 MG/ML IJ SOLN
INTRAOCULAR | Status: DC | PRN
  Administered 2014-02-17: 07:00:00

## 2014-02-17 MED ORDER — EPINEPHRINE HCL 1 MG/ML IJ SOLN
INTRAMUSCULAR | Status: AC
  Filled 2014-02-17: qty 1

## 2014-02-17 MED ORDER — FENTANYL CITRATE 0.05 MG/ML IJ SOLN: 25.0000 ug | INTRAMUSCULAR | Status: DC | PRN

## 2014-02-17 MED ORDER — PROVISC 10 MG/ML IO SOLN
INTRAOCULAR | Status: DC | PRN
  Administered 2014-02-17: 0.85 mL via INTRAOCULAR

## 2014-02-17 MED ORDER — LIDOCAINE HCL (PF) 1 % IJ SOLN
INTRAMUSCULAR | Status: DC | PRN
  Administered 2014-02-17: .4 mL

## 2014-02-17 MED ORDER — MIDAZOLAM HCL 2 MG/2ML IJ SOLN
1.0000 mg | INTRAMUSCULAR | Status: DC | PRN
  Administered 2014-02-17 (×2): 2 mg via INTRAVENOUS

## 2014-02-17 MED ORDER — LIDOCAINE 3.5 % OP GEL OPTIME - NO CHARGE
OPHTHALMIC | Status: DC | PRN
  Administered 2014-02-17: 2 [drp] via OPHTHALMIC

## 2014-02-17 SURGICAL SUPPLY — 33 items
CAPSULAR TENSION RING-AMO (OPHTHALMIC RELATED) IMPLANT
CLOTH BEACON ORANGE TIMEOUT ST (SAFETY) ×1 IMPLANT
EYE SHIELD UNIVERSAL CLEAR (GAUZE/BANDAGES/DRESSINGS) ×1 IMPLANT
GLOVE BIO SURGEON STRL SZ 6.5 (GLOVE) IMPLANT
GLOVE BIOGEL PI IND STRL 6.5 (GLOVE) IMPLANT
GLOVE BIOGEL PI IND STRL 7.0 (GLOVE) IMPLANT
GLOVE BIOGEL PI IND STRL 7.5 (GLOVE) IMPLANT
GLOVE BIOGEL PI INDICATOR 6.5 (GLOVE)
GLOVE BIOGEL PI INDICATOR 7.0 (GLOVE) ×1
GLOVE BIOGEL PI INDICATOR 7.5 (GLOVE)
GLOVE ECLIPSE 6.5 STRL STRAW (GLOVE) IMPLANT
GLOVE ECLIPSE 7.0 STRL STRAW (GLOVE) IMPLANT
GLOVE ECLIPSE 7.5 STRL STRAW (GLOVE) IMPLANT
GLOVE EXAM NITRILE LRG STRL (GLOVE) IMPLANT
GLOVE EXAM NITRILE MD LF STRL (GLOVE) IMPLANT
GLOVE SKINSENSE NS SZ6.5 (GLOVE)
GLOVE SKINSENSE NS SZ7.0 (GLOVE)
GLOVE SKINSENSE STRL SZ6.5 (GLOVE) IMPLANT
GLOVE SKINSENSE STRL SZ7.0 (GLOVE) IMPLANT
GLOVE SS N UNI LF 7.0 STRL (GLOVE) ×1 IMPLANT
KIT VITRECTOMY (OPHTHALMIC RELATED) IMPLANT
PAD ARMBOARD 7.5X6 YLW CONV (MISCELLANEOUS) ×1 IMPLANT
PROC W NO LENS (INTRAOCULAR LENS)
PROC W SPEC LENS (INTRAOCULAR LENS)
PROCESS W NO LENS (INTRAOCULAR LENS) IMPLANT
PROCESS W SPEC LENS (INTRAOCULAR LENS) IMPLANT
RING MALYGIN (MISCELLANEOUS) IMPLANT
SIGHTPATH CAT PROC W REG LENS (Ophthalmic Related) ×2 IMPLANT
SYR TB 1ML LL NO SAFETY (SYRINGE) ×1 IMPLANT
TAPE SURG TRANSPORE 1 IN (GAUZE/BANDAGES/DRESSINGS) IMPLANT
TAPE SURGICAL TRANSPORE 1 IN (GAUZE/BANDAGES/DRESSINGS) ×1
VISCOELASTIC ADDITIONAL (OPHTHALMIC RELATED) IMPLANT
WATER STERILE IRR 250ML POUR (IV SOLUTION) ×1 IMPLANT

## 2014-02-17 NOTE — Transfer of Care (Signed)
Immediate Anesthesia Transfer of Care Note  Patient: Phillip Preston  Procedure(s) Performed: Procedure(s) with comments: CATARACT EXTRACTION PHACO AND INTRAOCULAR LENS PLACEMENT (IOC) (Right) - CDE 6.87  Patient Location: PACU  Anesthesia Type:MAC  Level of Consciousness: awake, alert , oriented and patient cooperative  Airway & Oxygen Therapy: Patient Spontanous Breathing  Post-op Assessment: Report given to PACU RN, Post -op Vital signs reviewed and stable and Patient moving all extremities  Post vital signs: Reviewed and stable  Complications: No apparent anesthesia complications

## 2014-02-17 NOTE — Anesthesia Postprocedure Evaluation (Signed)
  Anesthesia Post-op Note  Patient: Phillip Preston  Procedure(s) Performed: Procedure(s) with comments: CATARACT EXTRACTION PHACO AND INTRAOCULAR LENS PLACEMENT (IOC) (Right) - CDE 6.87  Patient Location: Short Stay  Anesthesia Type:MAC  Level of Consciousness: awake, alert , oriented and patient cooperative  Airway and Oxygen Therapy: Patient Spontanous Breathing  Post-op Pain: none  Post-op Assessment: Post-op Vital signs reviewed, Patient's Cardiovascular Status Stable, Respiratory Function Stable, Patent Airway and Pain level controlled  Post-op Vital Signs: Reviewed and stable  Last Vitals:  Filed Vitals:   02/17/14 0715  BP: 104/66  Pulse:   Temp:   Resp: 30    Complications: No apparent anesthesia complications

## 2014-02-17 NOTE — Anesthesia Preprocedure Evaluation (Signed)
Anesthesia Evaluation  Patient identified by MRN, date of birth, ID band Patient awake    Reviewed: Allergy & Precautions, H&P , NPO status , Patient's Chart, lab work & pertinent test results, reviewed documented beta blocker date and time   Airway Mallampati: II TM Distance: >3 FB Neck ROM: Full    Dental  (+) Teeth Intact   Pulmonary former smoker,  breath sounds clear to auscultation        Cardiovascular hypertension, Pt. on medications and Pt. on home beta blockers + Past MI, + Cardiac Stents and + Peripheral Vascular Disease Rhythm:Regular Rate:Normal     Neuro/Psych    GI/Hepatic GERD-  Controlled and Medicated,  Endo/Other    Renal/GU      Musculoskeletal   Abdominal   Peds  Hematology   Anesthesia Other Findings   Reproductive/Obstetrics                           Anesthesia Physical Anesthesia Plan  ASA: III  Anesthesia Plan: MAC   Post-op Pain Management:    Induction: Intravenous  Airway Management Planned: Nasal Cannula  Additional Equipment:   Intra-op Plan:   Post-operative Plan:   Informed Consent: I have reviewed the patients History and Physical, chart, labs and discussed the procedure including the risks, benefits and alternatives for the proposed anesthesia with the patient or authorized representative who has indicated his/her understanding and acceptance.     Plan Discussed with:   Anesthesia Plan Comments:         Anesthesia Quick Evaluation

## 2014-02-17 NOTE — Op Note (Signed)
Date of Admission: 02/17/2014  Date of Surgery: 02/17/2014   Pre-Op Dx: Cataract Right Eye  Post-Op Dx: Nuclear Cataract Right  Eye,  Dx Code 366.16  Surgeon: Tonny Branch, M.D.  Assistants: None  Anesthesia: Topical with MAC  Indications: Painless, progressive loss of vision with compromise of daily activities.  Surgery: Cataract Extraction with Intraocular lens Implant Right Eye  Discription: The patient had dilating drops and viscous lidocaine placed into the Right eye in the pre-op holding area. After transfer to the operating room, a time out was performed. The patient was then prepped and draped. Beginning with a 86 degree blade a paracentesis port was made at the surgeon's 2 o'clock position. The anterior chamber was then filled with 1% non-preserved lidocaine. This was followed by filling the anterior chamber with Provisc.  A 2.13mm keratome blade was used to make a clear corneal incision at the temporal limbus.  A bent cystatome needle was used to create a continuous tear capsulotomy. Hydrodissection was performed with balanced salt solution on a Fine canula. The lens nucleus was then removed using the phacoemulsification handpiece. Residual cortex was removed with the I&A handpiece. The anterior chamber and capsular bag were refilled with Provisc. A posterior chamber intraocular lens was placed into the capsular bag with it's injector. The implant was positioned with the Kuglan hook. The Provisc was then removed from the anterior chamber and capsular bag with the I&A handpiece. Stromal hydration of the main incision and paracentesis port was performed with BSS on a Fine canula. The wounds were tested for leak which was negative. The patient tolerated the procedure well. There were no operative complications. The patient was then transferred to the recovery room in stable condition.  Complications: None  Specimen: None  EBL: None  Prosthetic device: Hoya iSert 250, power 20.5 D, SN  [id number].

## 2014-02-17 NOTE — H&P (Signed)
I have reviewed the H&P, the patient was re-examined, and I have identified no interval changes in medical condition and plan of care since the history and physical of record  

## 2014-02-17 NOTE — Discharge Instructions (Signed)

## 2014-02-18 ENCOUNTER — Encounter (HOSPITAL_COMMUNITY): Payer: Self-pay | Admitting: Ophthalmology

## 2014-02-25 ENCOUNTER — Encounter (HOSPITAL_COMMUNITY): Payer: Self-pay | Admitting: Pharmacy Technician

## 2014-03-04 ENCOUNTER — Encounter (HOSPITAL_COMMUNITY)
Admission: RE | Admit: 2014-03-04 | Discharge: 2014-03-04 | Disposition: A | Discharge status: Home / Self Care | Payer: Medicare Other | Source: Ambulatory Visit | Attending: Ophthalmology | Admitting: Ophthalmology

## 2014-03-12 MED ORDER — NEOMYCIN-POLYMYXIN-DEXAMETH 3.5-10000-0.1 OP SUSP
OPHTHALMIC | Status: AC
  Filled 2014-03-12: qty 5

## 2014-03-12 MED ORDER — LIDOCAINE HCL 3.5 % OP GEL
OPHTHALMIC | Status: AC
  Filled 2014-03-12: qty 1

## 2014-03-12 MED ORDER — CYCLOPENTOLATE-PHENYLEPHRINE OP SOLN OPTIME - NO CHARGE
OPHTHALMIC | Status: AC
  Filled 2014-03-12: qty 2

## 2014-03-12 MED ORDER — PHENYLEPHRINE HCL 2.5 % OP SOLN
OPHTHALMIC | Status: AC
  Filled 2014-03-12: qty 15

## 2014-03-12 MED ORDER — LIDOCAINE HCL (PF) 1 % IJ SOLN
INTRAMUSCULAR | Status: AC
  Filled 2014-03-12: qty 2

## 2014-03-12 MED ORDER — TETRACAINE HCL 0.5 % OP SOLN
OPHTHALMIC | Status: AC
  Filled 2014-03-12: qty 2

## 2014-03-13 ENCOUNTER — Encounter (HOSPITAL_COMMUNITY)
Admission: RE | Disposition: A | Discharge status: Home / Self Care | Payer: Self-pay | Source: Ambulatory Visit | Attending: Ophthalmology

## 2014-03-13 ENCOUNTER — Ambulatory Visit (HOSPITAL_COMMUNITY)
Admission: RE | Admit: 2014-03-13 | Discharge: 2014-03-13 | Disposition: A | Discharge status: Home / Self Care | Payer: Medicare Other | Source: Ambulatory Visit | Attending: Ophthalmology | Admitting: Ophthalmology

## 2014-03-13 ENCOUNTER — Encounter (HOSPITAL_COMMUNITY): Payer: Self-pay | Admitting: *Deleted

## 2014-03-13 ENCOUNTER — Encounter (HOSPITAL_COMMUNITY): Payer: Medicare Other | Admitting: Anesthesiology

## 2014-03-13 ENCOUNTER — Ambulatory Visit (HOSPITAL_COMMUNITY): Payer: Medicare Other | Admitting: Anesthesiology

## 2014-03-13 DIAGNOSIS — H269 Unspecified cataract: Secondary | ICD-10-CM | POA: Insufficient documentation

## 2014-03-13 DIAGNOSIS — Z79899 Other long term (current) drug therapy: Secondary | ICD-10-CM | POA: Insufficient documentation

## 2014-03-13 DIAGNOSIS — Z7982 Long term (current) use of aspirin: Secondary | ICD-10-CM | POA: Insufficient documentation

## 2014-03-13 DIAGNOSIS — K219 Gastro-esophageal reflux disease without esophagitis: Secondary | ICD-10-CM | POA: Insufficient documentation

## 2014-03-13 DIAGNOSIS — Z87891 Personal history of nicotine dependence: Secondary | ICD-10-CM | POA: Insufficient documentation

## 2014-03-13 DIAGNOSIS — I10 Essential (primary) hypertension: Secondary | ICD-10-CM | POA: Insufficient documentation

## 2014-03-13 HISTORY — PX: CATARACT EXTRACTION W/PHACO: SHX586

## 2014-03-13 SURGERY — PHACOEMULSIFICATION, CATARACT, WITH IOL INSERTION
Anesthesia: Monitor Anesthesia Care | Site: Eye | Laterality: Left

## 2014-03-13 MED ORDER — POVIDONE-IODINE 5 % OP SOLN
OPHTHALMIC | Status: DC | PRN
  Administered 2014-03-13: 1 via OPHTHALMIC

## 2014-03-13 MED ORDER — LIDOCAINE 3.5 % OP GEL OPTIME - NO CHARGE
OPHTHALMIC | Status: DC | PRN
  Administered 2014-03-13: 2 [drp] via OPHTHALMIC

## 2014-03-13 MED ORDER — ONDANSETRON HCL 4 MG/2ML IJ SOLN: 4.0000 mg | Freq: Once | INTRAMUSCULAR | Status: DC | PRN

## 2014-03-13 MED ORDER — MIDAZOLAM HCL 2 MG/2ML IJ SOLN
INTRAMUSCULAR | Status: AC
  Filled 2014-03-13: qty 2

## 2014-03-13 MED ORDER — PHENYLEPHRINE HCL 2.5 % OP SOLN
1.0000 [drp] | OPHTHALMIC | Status: AC
  Administered 2014-03-13 (×3): 1 [drp] via OPHTHALMIC

## 2014-03-13 MED ORDER — LIDOCAINE HCL (PF) 1 % IJ SOLN
INTRAMUSCULAR | Status: DC | PRN
  Administered 2014-03-13: .5 mL

## 2014-03-13 MED ORDER — LIDOCAINE HCL 3.5 % OP GEL: 1.0000 "application " | Freq: Once | OPHTHALMIC | Status: DC

## 2014-03-13 MED ORDER — FENTANYL CITRATE 0.05 MG/ML IJ SOLN
25.0000 ug | INTRAMUSCULAR | Status: AC
  Administered 2014-03-13: 25 ug via INTRAVENOUS

## 2014-03-13 MED ORDER — FENTANYL CITRATE 0.05 MG/ML IJ SOLN: 25.0000 ug | INTRAMUSCULAR | Status: DC | PRN

## 2014-03-13 MED ORDER — FENTANYL CITRATE 0.05 MG/ML IJ SOLN
INTRAMUSCULAR | Status: AC
  Filled 2014-03-13: qty 2

## 2014-03-13 MED ORDER — MIDAZOLAM HCL 2 MG/2ML IJ SOLN
1.0000 mg | INTRAMUSCULAR | Status: DC | PRN
  Administered 2014-03-13 (×2): 2 mg via INTRAVENOUS

## 2014-03-13 MED ORDER — BSS IO SOLN
INTRAOCULAR | Status: DC | PRN
  Administered 2014-03-13: 15 mL via INTRAOCULAR

## 2014-03-13 MED ORDER — EPINEPHRINE HCL 1 MG/ML IJ SOLN
INTRAOCULAR | Status: DC | PRN
  Administered 2014-03-13: 11:00:00

## 2014-03-13 MED ORDER — LACTATED RINGERS IV SOLN
INTRAVENOUS | Status: DC
  Administered 2014-03-13: 11:00:00 via INTRAVENOUS

## 2014-03-13 MED ORDER — PROVISC 10 MG/ML IO SOLN
INTRAOCULAR | Status: DC | PRN
  Administered 2014-03-13: 0.85 mL via INTRAOCULAR

## 2014-03-13 MED ORDER — NEOMYCIN-POLYMYXIN-DEXAMETH 3.5-10000-0.1 OP SUSP
OPHTHALMIC | Status: DC | PRN
  Administered 2014-03-13: 2 [drp] via OPHTHALMIC

## 2014-03-13 MED ORDER — CYCLOPENTOLATE-PHENYLEPHRINE 0.2-1 % OP SOLN
1.0000 [drp] | OPHTHALMIC | Status: AC
  Administered 2014-03-13 (×3): 1 [drp] via OPHTHALMIC

## 2014-03-13 MED ORDER — TETRACAINE HCL 0.5 % OP SOLN
1.0000 [drp] | OPHTHALMIC | Status: AC
  Administered 2014-03-13 (×3): 1 [drp] via OPHTHALMIC

## 2014-03-13 SURGICAL SUPPLY — 32 items
CAPSULAR TENSION RING-AMO (OPHTHALMIC RELATED) IMPLANT
CLOTH BEACON ORANGE TIMEOUT ST (SAFETY) ×1 IMPLANT
EYE SHIELD UNIVERSAL CLEAR (GAUZE/BANDAGES/DRESSINGS) ×1 IMPLANT
GLOVE BIO SURGEON STRL SZ 6.5 (GLOVE) IMPLANT
GLOVE BIOGEL PI IND STRL 6.5 (GLOVE) IMPLANT
GLOVE BIOGEL PI IND STRL 7.0 (GLOVE) IMPLANT
GLOVE BIOGEL PI IND STRL 7.5 (GLOVE) IMPLANT
GLOVE BIOGEL PI INDICATOR 6.5 (GLOVE) ×1
GLOVE BIOGEL PI INDICATOR 7.0 (GLOVE)
GLOVE BIOGEL PI INDICATOR 7.5 (GLOVE)
GLOVE ECLIPSE 6.5 STRL STRAW (GLOVE) IMPLANT
GLOVE ECLIPSE 7.0 STRL STRAW (GLOVE) IMPLANT
GLOVE ECLIPSE 7.5 STRL STRAW (GLOVE) IMPLANT
GLOVE EXAM NITRILE LRG STRL (GLOVE) IMPLANT
GLOVE EXAM NITRILE MD LF STRL (GLOVE) IMPLANT
GLOVE SKINSENSE NS SZ6.5 (GLOVE)
GLOVE SKINSENSE NS SZ7.0 (GLOVE) ×1
GLOVE SKINSENSE STRL SZ6.5 (GLOVE) IMPLANT
GLOVE SKINSENSE STRL SZ7.0 (GLOVE) IMPLANT
KIT VITRECTOMY (OPHTHALMIC RELATED) IMPLANT
PAD ARMBOARD 7.5X6 YLW CONV (MISCELLANEOUS) ×1 IMPLANT
PROC W NO LENS (INTRAOCULAR LENS)
PROC W SPEC LENS (INTRAOCULAR LENS)
PROCESS W NO LENS (INTRAOCULAR LENS) IMPLANT
PROCESS W SPEC LENS (INTRAOCULAR LENS) IMPLANT
RING MALYGIN (MISCELLANEOUS) IMPLANT
SIGHTPATH CAT PROC W REG LENS (Ophthalmic Related) ×2 IMPLANT
SYR TB 1ML LL NO SAFETY (SYRINGE) ×1 IMPLANT
TAPE SURG TRANSPORE 1 IN (GAUZE/BANDAGES/DRESSINGS) IMPLANT
TAPE SURGICAL TRANSPORE 1 IN (GAUZE/BANDAGES/DRESSINGS) ×1
VISCOELASTIC ADDITIONAL (OPHTHALMIC RELATED) IMPLANT
WATER STERILE IRR 250ML POUR (IV SOLUTION) ×1 IMPLANT

## 2014-03-13 NOTE — Anesthesia Postprocedure Evaluation (Signed)
  Anesthesia Post-op Note  Patient: Phillip Preston  Procedure(s) Performed: Procedure(s) with comments: CATARACT EXTRACTION PHACO AND INTRAOCULAR LENS PLACEMENT (IOC) (Left) - CDE:  5.42  Patient Location: Short Stay  Anesthesia Type:MAC  Level of Consciousness: awake, alert , oriented and patient cooperative  Airway and Oxygen Therapy: Patient Spontanous Breathing  Post-op Pain: none  Post-op Assessment: Post-op Vital signs reviewed, Patient's Cardiovascular Status Stable, Respiratory Function Stable, Patent Airway and Adequate PO intake  Post-op Vital Signs: Reviewed and stable  Last Vitals:  Filed Vitals:   03/13/14 1120  BP: 103/65  Resp: 35    Complications: No apparent anesthesia complications

## 2014-03-13 NOTE — Transfer of Care (Signed)
Immediate Anesthesia Transfer of Care Note  Patient: Phillip Preston  Procedure(s) Performed: Procedure(s) with comments: CATARACT EXTRACTION PHACO AND INTRAOCULAR LENS PLACEMENT (IOC) (Left) - CDE:  5.42  Patient Location: Short Stay  Anesthesia Type:MAC  Level of Consciousness: awake, alert , oriented and patient cooperative  Airway & Oxygen Therapy: Patient Spontanous Breathing  Post-op Assessment: Report given to PACU RN, Post -op Vital signs reviewed and stable and Patient moving all extremities  Post vital signs: Reviewed and stable  Complications: No apparent anesthesia complications

## 2014-03-13 NOTE — Anesthesia Preprocedure Evaluation (Signed)
Anesthesia Evaluation  Patient identified by MRN, date of birth, ID band Patient awake    Reviewed: Allergy & Precautions, H&P , NPO status , Patient's Chart, lab work & pertinent test results, reviewed documented beta blocker date and time   Airway Mallampati: II TM Distance: >3 FB Neck ROM: Full    Dental  (+) Teeth Intact   Pulmonary former smoker,  breath sounds clear to auscultation        Cardiovascular hypertension, Pt. on medications and Pt. on home beta blockers + Past MI, + Cardiac Stents and + Peripheral Vascular Disease Rhythm:Regular Rate:Normal     Neuro/Psych    GI/Hepatic GERD-  Controlled and Medicated,  Endo/Other    Renal/GU      Musculoskeletal   Abdominal   Peds  Hematology   Anesthesia Other Findings   Reproductive/Obstetrics                           Anesthesia Physical Anesthesia Plan  ASA: III  Anesthesia Plan: MAC   Post-op Pain Management:    Induction: Intravenous  Airway Management Planned: Nasal Cannula  Additional Equipment:   Intra-op Plan:   Post-operative Plan:   Informed Consent: I have reviewed the patients History and Physical, chart, labs and discussed the procedure including the risks, benefits and alternatives for the proposed anesthesia with the patient or authorized representative who has indicated his/her understanding and acceptance.     Plan Discussed with:   Anesthesia Plan Comments:         Anesthesia Quick Evaluation

## 2014-03-13 NOTE — Op Note (Signed)
Date of Admission: 03/13/2014  Date of Surgery: 03/13/2014   Pre-Op Dx: Cataract Left Eye  Post-Op Dx: Cataract Left  Eye,  Dx Code 366.16  Surgeon: Tonny Branch, M.D.  Assistants: None  Anesthesia: Topical with MAC  Indications: Painless, progressive loss of vision with compromise of daily activities.  Surgery: Cataract Extraction with Intraocular lens Implant Left Eye  Discription: The patient had dilating drops and viscous lidocaine placed into the Left eye in the pre-op holding area. After transfer to the operating room, a time out was performed. The patient was then prepped and draped. Beginning with a 79 degree blade a paracentesis port was made at the surgeon's 2 o'clock position. The anterior chamber was then filled with 1% non-preserved lidocaine. This was followed by filling the anterior chamber with Provisc.  A 2.55mm keratome blade was used to make a clear corneal incision at the temporal limbus.  A bent cystatome needle was used to create a continuous tear capsulotomy. Hydrodissection was performed with balanced salt solution on a Fine canula. The lens nucleus was then removed using the phacoemulsification handpiece. Residual cortex was removed with the I&A handpiece. The anterior chamber and capsular bag were refilled with Provisc. A posterior chamber intraocular lens was placed into the capsular bag with it's injector. The implant was positioned with the Kuglan hook. The Provisc was then removed from the anterior chamber and capsular bag with the I&A handpiece. Stromal hydration of the main incision and paracentesis port was performed with BSS on a Fine canula. The wounds were tested for leak which was negative. The patient tolerated the procedure well. There were no operative complications. The patient was then transferred to the recovery room in stable condition.  Complications: None  Specimen: None  EBL: None  Prosthetic device: Hoya iSert 250, power 20.5 D, SN [id number].

## 2014-03-13 NOTE — H&P (Signed)
I have reviewed the H&P, the patient was re-examined, and I have identified no interval changes in medical condition and plan of care since the history and physical of record  

## 2014-03-17 ENCOUNTER — Encounter (HOSPITAL_COMMUNITY): Payer: Self-pay | Admitting: Ophthalmology

## 2014-12-11 ENCOUNTER — Encounter: Payer: Self-pay | Admitting: Cardiovascular Disease

## 2014-12-11 ENCOUNTER — Ambulatory Visit (INDEPENDENT_AMBULATORY_CARE_PROVIDER_SITE_OTHER): Payer: Medicare Other | Admitting: Cardiovascular Disease

## 2014-12-11 VITALS — BP 132/70 | HR 96 | Ht 71.0 in | Wt 235.2 lb

## 2014-12-11 DIAGNOSIS — I10 Essential (primary) hypertension: Secondary | ICD-10-CM

## 2014-12-11 DIAGNOSIS — M6530 Trigger finger, unspecified finger: Secondary | ICD-10-CM | POA: Diagnosis not present

## 2014-12-11 DIAGNOSIS — I2510 Atherosclerotic heart disease of native coronary artery without angina pectoris: Secondary | ICD-10-CM | POA: Diagnosis not present

## 2014-12-11 DIAGNOSIS — I252 Old myocardial infarction: Secondary | ICD-10-CM

## 2014-12-11 DIAGNOSIS — E785 Hyperlipidemia, unspecified: Secondary | ICD-10-CM | POA: Diagnosis not present

## 2014-12-11 DIAGNOSIS — IMO0002 Reserved for concepts with insufficient information to code with codable children: Secondary | ICD-10-CM

## 2014-12-11 NOTE — Progress Notes (Signed)
Patient ID: Phillip Preston, male   DOB: Jan 08, 1946, 69 y.o.   MRN: [id number]      SUBJECTIVE: The patient is a 69 year old male who I am meeting for the first time. He has a history of coronary artery disease and sustained an inferior STEMI for which he underwent percutaneous coronary intervention of the RCA in 1996. He had a 50-60% proximal LAD, 30% proximal LAD, 50-60% proximal circumflex. He also has history of hypertension and hyperlipidemia.   He is feeling well and denies chest pain, shortness of breath, palpitations, dizziness, and leg swelling. He enjoys walking, fishing, and dancing once a week. He has had orthopedic issues with his right little finger and left palm.  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  ECG performed in the office today demonstrates normal sinus rhythm, old inferior infarct, and a diffuse ST and T-wave abnormality, most prominent in the inferior leads and V4 through V6. I compared this to an ECG dated 12/19/13 and there were no significant differences.   Review of Systems: As per "subjective", otherwise negative.  No Known Allergies  Current Outpatient Prescriptions  Medication Sig Dispense Refill  . aspirin 81 MG tablet Take 81 mg by mouth daily.    Marland Kitchen atorvastatin (LIPITOR) 20 MG tablet TAKE ONE TABLET BY MOUTH DAILY 30 tablet 11  . chlorthalidone (HYGROTON) 25 MG tablet TAKE 1 TABLET ONCE DAILY 30 tablet 11  . fish oil-omega-3 fatty acids 1000 MG capsule Take 1 capsule by mouth 2 (two) times daily.      . metoprolol succinate (TOPROL-XL) 50 MG 24 hr tablet TAKE ONE TABLET BY MOUTH AT BEDTIME. TAKE WITH OR IMMEDIATELY FOLLOWING A MEAL 30 tablet 11  . omeprazole (PRILOSEC) 40 MG capsule Take 1 capsule (40 mg total) by mouth daily. 90 capsule 3  . ramipril (ALTACE) 10 MG capsule TAKE ONE CAPSULE ONCE DAILY 30 capsule 11   No current facility-administered medications for this visit.      Past Medical History  Diagnosis Date  . Arteriosclerotic cardiovascular disease (ASCVD)     IMI requiring RCA stent; EF- 50%;repeat RCA stent in 6/96 with residual 50-60% mid LAD & Cx  . Hypertension   . Hyperlipidemia   . Tobacco abuse, in remission     40 pack years; discontinued in 1996  . Peripheral vascular disease     decreased distal pulses; asymptomatic  . Trigger finger     Right fifth finger  . Gastroesophageal reflux disease   . Colonic polyp 2009  . Cholelithiasis 2009    Asymptomatic  . HOH (hard of hearing)   . Myocardial infarction 1996  . Cancer     skin cancer    Past Surgical History  Procedure Laterality Date  . Appendectomy    . Colonoscopy w/ polypectomy  2009  . Cardiac catheterization      2 stents  . Finger tendon repair Right     pinky and ring finger  . Cataract extraction w/phaco Right 02/17/2014    Procedure: CATARACT EXTRACTION PHACO AND INTRAOCULAR LENS PLACEMENT (IOC);  Surgeon: Tonny Branch, MD;  Location: [facility];  Service: Ophthalmology;  Laterality: Right;  CDE 6.87  . Cataract extraction w/phaco Left 03/13/2014    Procedure: CATARACT EXTRACTION PHACO AND INTRAOCULAR LENS PLACEMENT (IOC);  Surgeon: Tonny Branch, MD;  Location: [facility];  Service: Ophthalmology;  Laterality: Left;  CDE:  5.42  History   Social History  . Marital Status: Married    Spouse Name: N/A  . Number of Children: N/A  . Years of Education: N/A   Occupational History  . Retired     Electrical engineer   Social History Main Topics  . Smoking status: Former Smoker -- 1.30 packs/day for 30 years    Start date: 09/12/1958    Quit date: 02/16/1995  . Smokeless tobacco: Not on file  . Alcohol Use: 0.0 oz/week    0 Standard drinks or equivalent per week     Comment: Moderate  . Drug Use: No  . Sexual Activity: Not on file   Other Topics Concern  . Not on file   Social History Narrative   Married     Filed Vitals:   12/11/14 1120  BP: 132/70  Pulse:  96  Height: 5\' 11"  (1.803 m)  Weight: 235 lb 3.2 oz (106.686 kg)  SpO2: 94%    PHYSICAL EXAM General: NAD HEENT: Normal. Neck: No JVD, no thyromegaly. Lungs: Clear to auscultation bilaterally with normal respiratory effort. CV: Nondisplaced PMI.  Regular rate and rhythm, normal S1/S2, no S3/S4, no murmur. No pretibial or periankle edema.  No carotid bruit.  Normal pedal pulses.  Abdomen: Soft, nontender, no hepatosplenomegaly, no distention.  Neurologic: Alert and oriented x 3.  Psych: Normal affect. Skin: Normal. Musculoskeletal: Normal range of motion, no gross deformities. Extremities: No clubbing or cyanosis.   ECG: Most recent ECG reviewed.      ASSESSMENT AND PLAN: 1. CAD: Stable ischemic heart disease. Continue ramipril, metoprolol, ASA, and statin. 2. Essential HTN: Well controlled. No changes. 3. Hyperlipidemia: HDL 31, LDL 36 on 12/18/13. Will repeat lipids. Continue statin therapy. 4. Right little finger and left palm tendon abnormalities: Will make a referral to orthopedic surgery.  Dispo: f/u 1 year.  Kate Sable, M.D., F.A.C.C.

## 2014-12-11 NOTE — Patient Instructions (Addendum)
Your physician wants you to follow-up in: 1 year with Dr Virgina Jock will receive a reminder letter in the mail two months in advance. If you don't receive a letter, please call our office to schedule the follow-up appointment.   Your physician recommends that you continue on your current medications as directed. Please refer to the Current Medication list given to you today.  Please get FASTING Lipid profile  You have been referred to orthopeodics, Dr.Stanley Aline Brochure     Thank you for choosing [facility] !

## 2014-12-17 LAB — LIPID PANEL
Cholesterol: 147 mg/dL (ref 0–200)
HDL: 34 mg/dL — AB (ref >=40)
LDL Cholesterol: 63 mg/dL (ref 0–99)
TRIGLYCERIDES: 249 mg/dL — AB (ref <150)
Total CHOL/HDL Ratio: 4.3 Ratio
VLDL: 50 mg/dL — ABNORMAL HIGH (ref 0–40)

## 2014-12-30 ENCOUNTER — Other Ambulatory Visit: Payer: Self-pay | Admitting: Cardiology

## 2014-12-30 MED ORDER — RAMIPRIL 10 MG PO CAPS: ORAL_CAPSULE | ORAL | Status: DC

## 2014-12-30 MED ORDER — CHLORTHALIDONE 25 MG PO TABS: ORAL_TABLET | ORAL | Status: DC

## 2014-12-30 NOTE — Telephone Encounter (Signed)
Received fax refill request  Rx # [id number] Medication:  Chlorthalidone 25 mg tablet Qty 30 Sig:  Take one tablet by mouth once daily Physician:  Harl Bowie

## 2014-12-30 NOTE — Telephone Encounter (Signed)
Received fax refill request  Rx # [id number] Medication:  Ramipril 10 mg capsule Qty 30 Sig:  Take one capsule by mouth once daily Physician:  Harl Bowie

## 2015-01-08 ENCOUNTER — Encounter: Payer: Self-pay | Admitting: Orthopedic Surgery

## 2015-01-08 ENCOUNTER — Ambulatory Visit (INDEPENDENT_AMBULATORY_CARE_PROVIDER_SITE_OTHER): Payer: Medicare Other | Admitting: Orthopedic Surgery

## 2015-01-08 VITALS — BP 130/82 | Ht 71.0 in | Wt 235.2 lb

## 2015-01-08 DIAGNOSIS — M24541 Contracture, right hand: Secondary | ICD-10-CM | POA: Diagnosis not present

## 2015-01-08 DIAGNOSIS — M720 Palmar fascial fibromatosis [Dupuytren]: Secondary | ICD-10-CM

## 2015-01-08 DIAGNOSIS — M24542 Contracture, left hand: Secondary | ICD-10-CM

## 2015-01-08 NOTE — Progress Notes (Signed)
Patient ID: Phillip Preston, male   DOB: [birth year], 69 y.o.   MRN: [id number]  Chief Complaint  Patient presents with  . Hand Problem    finger contractures, small right finger and thumb, all left fingers, REF Phillip Preston is a 69 y.o. male.   HPI This 69 yo male presents with contractures of the right and left hand from Duputren's contracture and is s/p release of the right small and ring finger in 1995 with recurrence of disase in the right small finger and contracture in the right thumb left thumb and early contracture in the left small and ring finger. He says he is crippled by the disease and he can't take care of himself in terms of his activities of daily living or grooming  He complains of throbbing constant 4 out of 10 pain it's worse with cold weather. He does have a history of tobacco use   Review of Systems Hearing loss, vision palms, chest pain, history of heart disease. Joint pain.  Past Medical History  Diagnosis Date  . Arteriosclerotic cardiovascular disease (ASCVD)     IMI requiring RCA stent; EF- 50%;repeat RCA stent in 6/96 with residual 50-60% mid LAD & Cx  . Hypertension   . Hyperlipidemia   . Tobacco abuse, in remission     40 pack years; discontinued in 1996  . Peripheral vascular disease     decreased distal pulses; asymptomatic  . Trigger finger     Right fifth finger  . Gastroesophageal reflux disease   . Colonic polyp 2009  . Cholelithiasis 2009    Asymptomatic  . HOH (hard of hearing)   . Myocardial infarction 1996  . Cancer     skin cancer    Past Surgical History  Procedure Laterality Date  . Appendectomy    . Colonoscopy w/ polypectomy  2009  . Cardiac catheterization      2 stents  . Finger tendon repair Right     pinky and ring finger  . Cataract extraction w/phaco Right 02/17/2014    Procedure: CATARACT EXTRACTION PHACO AND INTRAOCULAR LENS PLACEMENT (IOC);  Surgeon: Tonny Branch, MD;  Location: [facility];  Service:  Ophthalmology;  Laterality: Right;  CDE 6.87  . Cataract extraction w/phaco Left 03/13/2014    Procedure: CATARACT EXTRACTION PHACO AND INTRAOCULAR LENS PLACEMENT (IOC);  Surgeon: Tonny Branch, MD;  Location: [facility];  Service: Ophthalmology;  Laterality: Left;  CDE:  5.42    Family History  Problem Relation Age of Onset  . Cancer Father     Social History History  Substance Use Topics  . Smoking status: Former Smoker -- 1.30 packs/day for 30 years    Start date: 09/12/1958    Quit date: 02/16/1995  . Smokeless tobacco: Not on file  . Alcohol Use: 0.0 oz/week    0 Standard drinks or equivalent per week     Comment: Moderate    No Known Allergies  Current Outpatient Prescriptions  Medication Sig Dispense Refill  . aspirin 81 MG tablet Take 81 mg by mouth daily.    Marland Kitchen atorvastatin (LIPITOR) 20 MG tablet TAKE ONE TABLET BY MOUTH DAILY 30 tablet 11  . chlorthalidone (HYGROTON) 25 MG tablet TAKE 1 TABLET ONCE DAILY 30 tablet 11  . fish oil-omega-3 fatty acids 1000 MG capsule Take 1 capsule by mouth 2 (two) times daily.      . metoprolol succinate (TOPROL-XL) 50 MG 24 hr tablet TAKE ONE TABLET BY MOUTH  AT BEDTIME. TAKE WITH OR IMMEDIATELY FOLLOWING A MEAL 30 tablet 11  . omeprazole (PRILOSEC) 40 MG capsule Take 1 capsule (40 mg total) by mouth daily. 90 capsule 3  . ramipril (ALTACE) 10 MG capsule TAKE ONE CAPSULE ONCE DAILY 30 capsule 11   No current facility-administered medications for this visit.       Physical Exam Blood pressure 130/82, height 5\' 11"  (1.803 m), weight 235 lb 3.2 oz (106.686 kg). Physical Exam The patient is well developed well nourished and well groomed. Orientation to person place and time is normal  Mood is pleasant.  We see in the thumbs of both hands contractures and tight bands which are palpable he also has contracture of the small finger on the right and developing contractures small and ring finger on the left had surgery on the right and the small  finger contracture has returned. These are not tender to palpation vascularity and color of the hand normal bilaterally  Data Reviewed No imaging was performed  Assessment Encounter Diagnoses  Name Primary?  . Contracture of finger joint, left   . Contracture of joint of finger, right   . Dupuytren's contracture of both hands Yes    Plan Recommend consult with a hand specialist to determine treatment. He is interested in needle tenotomy and I also informed him about injection therapy as well as open surgery.

## 2015-01-08 NOTE — Patient Instructions (Addendum)
We will refer you to hand specialist Dr Erlinda Hong

## 2015-01-14 ENCOUNTER — Other Ambulatory Visit: Payer: Self-pay | Admitting: *Deleted

## 2015-01-14 ENCOUNTER — Telehealth: Payer: Self-pay | Admitting: *Deleted

## 2015-01-14 DIAGNOSIS — M720 Palmar fascial fibromatosis [Dupuytren]: Secondary | ICD-10-CM

## 2015-01-14 MED ORDER — ATORVASTATIN CALCIUM 20 MG PO TABS: ORAL_TABLET | ORAL | Status: DC

## 2015-01-14 MED ORDER — METOPROLOL SUCCINATE ER 50 MG PO TB24: ORAL_TABLET | ORAL | Status: DC

## 2015-01-14 NOTE — Telephone Encounter (Signed)
REFERRAL FAXED TO [facility]

## 2015-01-15 ENCOUNTER — Other Ambulatory Visit: Payer: Self-pay

## 2015-01-15 MED ORDER — OMEPRAZOLE 40 MG PO CPDR: 40.0000 mg | DELAYED_RELEASE_CAPSULE | Freq: Every day | ORAL | Status: DC

## 2015-01-15 NOTE — Telephone Encounter (Signed)
Refilled omeprazole.

## 2015-02-04 NOTE — Telephone Encounter (Signed)
appt 01/22/15 @ 9:45 w/ DR Erlinda Hong

## 2016-01-05 ENCOUNTER — Ambulatory Visit (INDEPENDENT_AMBULATORY_CARE_PROVIDER_SITE_OTHER): Payer: Medicare Other | Admitting: Cardiovascular Disease

## 2016-01-05 ENCOUNTER — Encounter: Payer: Self-pay | Admitting: Cardiovascular Disease

## 2016-01-05 VITALS — BP 118/84 | HR 110 | Ht 71.0 in | Wt 241.0 lb

## 2016-01-05 DIAGNOSIS — I10 Essential (primary) hypertension: Secondary | ICD-10-CM | POA: Diagnosis not present

## 2016-01-05 DIAGNOSIS — R000 Tachycardia, unspecified: Secondary | ICD-10-CM | POA: Diagnosis not present

## 2016-01-05 DIAGNOSIS — E785 Hyperlipidemia, unspecified: Secondary | ICD-10-CM | POA: Diagnosis not present

## 2016-01-05 DIAGNOSIS — I252 Old myocardial infarction: Secondary | ICD-10-CM

## 2016-01-05 DIAGNOSIS — I2510 Atherosclerotic heart disease of native coronary artery without angina pectoris: Secondary | ICD-10-CM

## 2016-01-05 NOTE — Progress Notes (Signed)
Patient ID: Phillip Preston, male   DOB: 05/06/1946, 70 y.o.   MRN: [id number]      SUBJECTIVE: The patient presents for annual follow-up of coronary artery disease. He sustained an inferior STEMI for which he underwent percutaneous coronary intervention of the RCA in 1996. He had a 50-60% proximal LAD, 30% proximal LAD, 50-60% proximal circumflex.  He also has history of hypertension and hyperlipidemia.   He is feeling well and denies chest pain, shortness of breath, palpitations, dizziness, and leg swelling.  ECG performed in the office today which I personally interpreted demonstrated sinus tachycardia with PACs, heart rate 102 bpm.  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  Review of Systems: As per "subjective", otherwise negative.  No Known Allergies  Current Outpatient Prescriptions  Medication Sig Dispense Refill  . aspirin 81 MG tablet Take 81 mg by mouth daily.    Marland Kitchen atorvastatin (LIPITOR) 20 MG tablet TAKE ONE TABLET BY MOUTH DAILY 30 tablet 11  . chlorthalidone (HYGROTON) 25 MG tablet TAKE 1 TABLET ONCE DAILY 30 tablet 11  . fish oil-omega-3 fatty acids 1000 MG capsule Take 1 capsule by mouth 2 (two) times daily.      . metoprolol succinate (TOPROL-XL) 50 MG 24 hr tablet TAKE ONE TABLET BY MOUTH AT BEDTIME. TAKE WITH OR IMMEDIATELY FOLLOWING A MEAL 30 tablet 11  . omeprazole (PRILOSEC) 40 MG capsule Take 1 capsule (40 mg total) by mouth daily. 90 capsule 3  . ramipril (ALTACE) 10 MG capsule TAKE ONE CAPSULE ONCE DAILY 30 capsule 11   No current facility-administered medications for this visit.    Past Medical History  Diagnosis Date  . Arteriosclerotic cardiovascular disease (ASCVD)     IMI requiring RCA stent; EF- 50%;repeat RCA stent in 6/96 with residual 50-60% mid LAD & Cx  . Hypertension   . Hyperlipidemia   . Tobacco abuse, in remission     40 pack years; discontinued in 1996  .  Peripheral vascular disease (HCC)     decreased distal pulses; asymptomatic  . Trigger finger     Right fifth finger  . Gastroesophageal reflux disease   . Colonic polyp 2009  . Cholelithiasis 2009    Asymptomatic  . HOH (hard of hearing)   . Myocardial infarction ([place]) 1996  . Cancer [facility])     skin cancer    Past Surgical History  Procedure Laterality Date  . Appendectomy    . Colonoscopy w/ polypectomy  2009  . Cardiac catheterization      2 stents  . Finger tendon repair Right     pinky and ring finger  . Cataract extraction w/phaco Right 02/17/2014    Procedure: CATARACT EXTRACTION PHACO AND INTRAOCULAR LENS PLACEMENT (IOC);  Surgeon: Tonny Branch, MD;  Location: [facility];  Service: Ophthalmology;  Laterality: Right;  CDE 6.87  . Cataract extraction w/phaco Left 03/13/2014    Procedure: CATARACT EXTRACTION PHACO AND INTRAOCULAR LENS PLACEMENT (IOC);  Surgeon: Tonny Branch, MD;  Location: [facility];  Service: Ophthalmology;  Laterality: Left;  CDE:  5.42    Social History   Social History  . Marital Status: Married    Spouse Name: N/A  . Number of Children: N/A  . Years of Education: N/A   Occupational History  . Retired     Electrical engineer   Social History Main Topics  . Smoking status: Former Smoker --  1.30 packs/day for 30 years    Start date: 09/12/1958    Quit date: 02/16/1995  . Smokeless tobacco: Not on file  . Alcohol Use: 0.0 oz/week    0 Standard drinks or equivalent per week     Comment: Moderate  . Drug Use: No  . Sexual Activity: Not on file   Other Topics Concern  . Not on file   Social History Narrative   Married     Filed Vitals:   01/05/16 0847  BP: 118/84  Pulse: 110  Height: 5\' 11"  (1.803 m)  Weight: 241 lb (109.317 kg)  SpO2: 95%    PHYSICAL EXAM General: NAD HEENT: Normal. Neck: No JVD, no thyromegaly. Lungs: Clear to auscultation bilaterally with normal respiratory effort. CV: Nondisplaced PMI.  Regular rate (90 bpm range) and  rhythm, normal S1/S2, no S3/S4, no murmur. No pretibial or periankle edema.  No carotid bruit.   Abdomen: Firm, obese. Neurologic: Alert and oriented.  Psych: Normal affect. Skin: Normal. Musculoskeletal: No gross deformities.  ECG: Most recent ECG reviewed.      ASSESSMENT AND PLAN: 1. CAD: Stable ischemic heart disease. Continue ramipril, metoprolol, ASA, and statin.  2. Essential HTN: Well controlled. No changes.  3. Hyperlipidemia: Will repeat lipids. Continue statin therapy.  Dispo: fu 1 year.  Kate Sable, M.D., F.A.C.C.

## 2016-01-05 NOTE — Patient Instructions (Signed)
Your physician wants you to follow-up in: 1 year with Dr Virgina Jock will receive a reminder letter in the mail two months in advance. If you don't receive a letter, please call our office to schedule the follow-up appointment.    Your physician recommends that you continue on your current medications as directed. Please refer to the Current Medication list given to you today.    If you need a refill on your cardiac medications before your next appointment, please call your pharmacy.    Get FASTING lipid lab work     Thank you for choosing [facility] !

## 2016-01-06 ENCOUNTER — Other Ambulatory Visit: Payer: Self-pay | Admitting: Cardiology

## 2016-01-09 LAB — LIPID PANEL
CHOL/HDL RATIO: 3.7 ratio (ref <=5.0)
Cholesterol: 144 mg/dL (ref 125–200)
HDL: 39 mg/dL — ABNORMAL LOW (ref >=40)
LDL CALC: 72 mg/dL (ref <130)
Triglycerides: 163 mg/dL — ABNORMAL HIGH (ref <150)
VLDL: 33 mg/dL — ABNORMAL HIGH (ref <30)

## 2016-01-19 ENCOUNTER — Other Ambulatory Visit: Payer: Self-pay | Admitting: Cardiology

## 2016-01-21 ENCOUNTER — Other Ambulatory Visit: Payer: Self-pay

## 2016-01-21 MED ORDER — ATORVASTATIN CALCIUM 20 MG PO TABS: 20.0000 mg | ORAL_TABLET | Freq: Every day | ORAL | Status: DC

## 2016-01-21 MED ORDER — METOPROLOL SUCCINATE ER 50 MG PO TB24: ORAL_TABLET | ORAL | Status: DC

## 2016-01-21 NOTE — Telephone Encounter (Signed)
Refill complete 

## 2016-04-20 ENCOUNTER — Other Ambulatory Visit: Payer: Self-pay | Admitting: Cardiovascular Disease

## 2016-08-20 ENCOUNTER — Other Ambulatory Visit: Payer: Self-pay | Admitting: Cardiovascular Disease

## 2016-09-20 ENCOUNTER — Other Ambulatory Visit: Payer: Self-pay | Admitting: Cardiovascular Disease

## 2016-10-20 ENCOUNTER — Other Ambulatory Visit: Payer: Self-pay

## 2016-10-20 ENCOUNTER — Other Ambulatory Visit: Payer: Self-pay | Admitting: *Deleted

## 2016-10-20 MED ORDER — CHLORTHALIDONE 25 MG PO TABS: 25.0000 mg | ORAL_TABLET | Freq: Every day | ORAL | 0 refills | Status: DC

## 2016-10-20 MED ORDER — RAMIPRIL 10 MG PO CAPS: 10.0000 mg | ORAL_CAPSULE | Freq: Every day | ORAL | 0 refills | Status: DC

## 2016-10-20 MED ORDER — ATORVASTATIN CALCIUM 20 MG PO TABS: 20.0000 mg | ORAL_TABLET | Freq: Every day | ORAL | 0 refills | Status: DC

## 2016-10-20 MED ORDER — METOPROLOL SUCCINATE ER 50 MG PO TB24: ORAL_TABLET | ORAL | 0 refills | Status: DC

## 2016-10-20 NOTE — Telephone Encounter (Signed)
Received msg on the refill vm from Ermalene Searing, RN case manager in the medication adherence dept with [facility] requesting refills be changed to ninety days and [facility] [place] Call back number for [facility] [phone]. She requested that these be sent in today as the patient is out of medication and also the patient would like a call at [phone] when they have been sent. Thanks, MI

## 2016-10-21 DIAGNOSIS — R69 Illness, unspecified: Secondary | ICD-10-CM | POA: Diagnosis not present

## 2016-11-16 DIAGNOSIS — E7800 Pure hypercholesterolemia, unspecified: Secondary | ICD-10-CM | POA: Diagnosis not present

## 2016-11-16 DIAGNOSIS — R739 Hyperglycemia, unspecified: Secondary | ICD-10-CM | POA: Diagnosis not present

## 2016-11-16 DIAGNOSIS — I2510 Atherosclerotic heart disease of native coronary artery without angina pectoris: Secondary | ICD-10-CM | POA: Diagnosis not present

## 2016-11-16 DIAGNOSIS — K219 Gastro-esophageal reflux disease without esophagitis: Secondary | ICD-10-CM | POA: Diagnosis not present

## 2016-11-16 DIAGNOSIS — I10 Essential (primary) hypertension: Secondary | ICD-10-CM | POA: Diagnosis not present

## 2016-11-22 ENCOUNTER — Other Ambulatory Visit: Payer: Self-pay

## 2016-11-22 MED ORDER — CHLORTHALIDONE 25 MG PO TABS: 25.0000 mg | ORAL_TABLET | Freq: Every day | ORAL | 1 refills | Status: DC

## 2016-11-22 NOTE — Telephone Encounter (Signed)
Refilled [facility]lorthalidone to [facility]

## 2016-12-26 DIAGNOSIS — I2510 Atherosclerotic heart disease of native coronary artery without angina pectoris: Secondary | ICD-10-CM | POA: Diagnosis not present

## 2016-12-26 DIAGNOSIS — I10 Essential (primary) hypertension: Secondary | ICD-10-CM | POA: Diagnosis not present

## 2016-12-26 DIAGNOSIS — E7800 Pure hypercholesterolemia, unspecified: Secondary | ICD-10-CM | POA: Diagnosis not present

## 2016-12-26 DIAGNOSIS — K219 Gastro-esophageal reflux disease without esophagitis: Secondary | ICD-10-CM | POA: Diagnosis not present

## 2016-12-26 DIAGNOSIS — R739 Hyperglycemia, unspecified: Secondary | ICD-10-CM | POA: Diagnosis not present

## 2016-12-28 DIAGNOSIS — Z713 Dietary counseling and surveillance: Secondary | ICD-10-CM | POA: Diagnosis not present

## 2016-12-28 DIAGNOSIS — E119 Type 2 diabetes mellitus without complications: Secondary | ICD-10-CM | POA: Diagnosis not present

## 2016-12-28 DIAGNOSIS — K219 Gastro-esophageal reflux disease without esophagitis: Secondary | ICD-10-CM | POA: Diagnosis not present

## 2016-12-28 DIAGNOSIS — I10 Essential (primary) hypertension: Secondary | ICD-10-CM | POA: Diagnosis not present

## 2016-12-28 DIAGNOSIS — I2510 Atherosclerotic heart disease of native coronary artery without angina pectoris: Secondary | ICD-10-CM | POA: Diagnosis not present

## 2016-12-28 DIAGNOSIS — D225 Melanocytic nevi of trunk: Secondary | ICD-10-CM | POA: Diagnosis not present

## 2016-12-28 DIAGNOSIS — Z0001 Encounter for general adult medical examination with abnormal findings: Secondary | ICD-10-CM | POA: Diagnosis not present

## 2017-01-04 ENCOUNTER — Encounter: Payer: Self-pay | Admitting: Cardiovascular Disease

## 2017-01-04 ENCOUNTER — Ambulatory Visit (INDEPENDENT_AMBULATORY_CARE_PROVIDER_SITE_OTHER): Payer: Medicare HMO | Admitting: Cardiovascular Disease

## 2017-01-04 VITALS — BP 126/82 | HR 93 | Ht 71.0 in | Wt 232.0 lb

## 2017-01-04 DIAGNOSIS — I10 Essential (primary) hypertension: Secondary | ICD-10-CM

## 2017-01-04 DIAGNOSIS — E782 Mixed hyperlipidemia: Secondary | ICD-10-CM | POA: Diagnosis not present

## 2017-01-04 DIAGNOSIS — R7309 Other abnormal glucose: Secondary | ICD-10-CM

## 2017-01-04 DIAGNOSIS — I252 Old myocardial infarction: Secondary | ICD-10-CM | POA: Diagnosis not present

## 2017-01-04 DIAGNOSIS — I2510 Atherosclerotic heart disease of native coronary artery without angina pectoris: Secondary | ICD-10-CM | POA: Diagnosis not present

## 2017-01-04 NOTE — Patient Instructions (Signed)
Medication Instructions:  Your physician recommends that you continue on your current medications as directed. Please refer to the Current Medication list given to you today.   Labwork: I WILL REQUEST A COPY OF LABS FROM PCP  Testing/Procedures: NONE  Follow-Up: Your physician wants you to follow-up in: 1 YEAR .  You will receive a reminder letter in the mail two months in advance. If you don't receive a letter, please call our office to schedule the follow-up appointment.   Any Other Special Instructions Will Be Listed Below (If Applicable).     If you need a refill on your cardiac medications before your next appointment, please call your pharmacy.   

## 2017-01-04 NOTE — Progress Notes (Signed)
SUBJECTIVE: The patient presents for annual follow-up of coronary artery disease. He sustained an inferior STEMI for which he underwent percutaneous coronary intervention of the RCA in 1996. He had a 50-60% proximal LAD, 30% proximal LAD, 50-60% proximal circumflex.  He also has history of hypertension and hyperlipidemia.   He denies chest pain, leg swelling, orthopnea, palpitations, and shortness of breath.  His primary health issues have been related to bilateral Dupuytren's contractures. 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  He tells me his HbA1c was 6.6% and so he began dieting and exercising for the past 2 weeks and has already lost 4.5 pounds.  He has changed the oil in his boat and says he plans on being out on the water for the next 3 months.  ECG performed in the office today which I ordered and personally interpreted demonstrates normal sinus rhythm with old inferoapical infarct and nonspecific ST segment and T-wave abnormalities.   Review of Systems: As per "subjective", otherwise negative.  No Known Allergies  Current Outpatient Prescriptions  Medication Sig Dispense Refill  . aspirin 81 MG tablet Take 81 mg by mouth daily.    Marland Kitchen atorvastatin (LIPITOR) 20 MG tablet Take 1 tablet (20 mg total) by mouth daily. 90 tablet 0  . chlorthalidone (HYGROTON) 25 MG tablet Take 1 tablet (25 mg total) by mouth daily. 90 tablet 1  . fish oil-omega-3 fatty acids 1000 MG capsule Take 1 capsule by mouth 2 (two) times daily.      . metoprolol succinate (TOPROL-XL) 50 MG 24 hr tablet TAKE ONE TABLET BY MOUTH AT BEDTIME. TAKE WITH OR IMMEDIATELY FOLLOWING A MEAL 90 tablet 0  . omeprazole (PRILOSEC) 40 MG capsule TAKE ONE CAPSULE BY MOUTH DAILY 90 capsule 3  . ramipril (ALTACE) 10 MG capsule Take 1 capsule (10 mg total) by mouth daily. 90  capsule 0   No current facility-administered medications for this visit.     Past Medical History:  Diagnosis Date  . Arteriosclerotic cardiovascular disease (ASCVD)    IMI requiring RCA stent; EF- 50%;repeat RCA stent in 6/96 with residual 50-60% mid LAD & Cx  . Cancer ([place])    skin cancer  . Cholelithiasis 2009   Asymptomatic  . Colonic polyp 2009  . Gastroesophageal reflux disease   . HOH (hard of hearing)   . Hyperlipidemia   . Hypertension   . Myocardial infarction ([place]) 1996  . Peripheral vascular disease (HCC)    decreased distal pulses; asymptomatic  . 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 Trigger finger    Right fifth finger    Past Surgical History:  Procedure Laterality Date  . APPENDECTOMY    . CARDIAC CATHETERIZATION     2 stents  . CATARACT EXTRACTION W/PHACO Right 02/17/2014   Procedure: CATARACT EXTRACTION PHACO AND INTRAOCULAR LENS PLACEMENT (IOC);  Surgeon: Tonny Branch, MD;  Location: [facility];  Service: Ophthalmology;  Laterality: Right;  CDE 6.87  . CATARACT EXTRACTION W/PHACO Left 03/13/2014   Procedure: CATARACT EXTRACTION PHACO AND INTRAOCULAR LENS PLACEMENT (IOC);  Surgeon: Tonny Branch, MD;  Location: [facility];  Service: Ophthalmology;  Laterality: Left;  CDE:  5.42  . COLONOSCOPY W/ POLYPECTOMY  2009  . FINGER TENDON REPAIR Right    pinky and  ring finger    Social History   Social History  . Marital status: Married    Spouse name: N/A  . Number of children: N/A  . Years of education: N/A   Occupational History  . Retired     Electrical engineer   Social History Main Topics  . Smoking status: Former Smoker    Packs/day: 1.30    Years: 30.00    Start date: 09/12/1958    Quit date: 02/16/1995  . Smokeless tobacco: Never Used  . Alcohol use 0.0 oz/week     Comment: Moderate  . Drug use: No  . Sexual activity: Not on file   Other Topics Concern  . Not on file   Social History Narrative   Married      Vitals:   01/04/17 0853  BP: 126/82  Pulse: 93  SpO2: 96%  Weight: 232 lb (105.2 kg)  Height: 5\' 11"  (1.803 m)    Wt Readings from Last 3 Encounters:  01/04/17 232 lb (105.2 kg)  01/05/16 241 lb (109.3 kg)  01/08/15 235 lb 3.2 oz (106.7 kg)     PHYSICAL EXAM General: NAD HEENT: Normal. Neck: No JVD, no thyromegaly. Lungs: Clear to auscultation bilaterally with normal respiratory effort. CV: Nondisplaced PMI.  Regular rate and rhythm, normal S1/S2, no S3/S4, no murmur. No pretibial or periankle edema.  No carotid bruit.   Abdomen: Soft, nontender, no distention.  Neurologic: Alert and oriented.  Psych: Normal affect. Skin: Normal. Musculoskeletal: Bilateral Dupuytren's contractures.    ECG: Most recent ECG reviewed.   Labs: Lab Results  Component Value Date/Time   K 4.3 02/10/2014 08:30 AM   BUN 17 02/10/2014 08:30 AM   CREATININE 1.20 02/10/2014 08:30 AM   CREATININE 1.13 12/18/2013 08:30 AM   ALT 21 12/18/2013 08:30 AM   TSH 2.635 01/02/2013 08:25 AM   HGB 14.4 02/10/2014 08:30 AM     Lipids: Lab Results  Component Value Date/Time   LDLCALC 72 01/08/2016 08:35 AM   CHOL 144 01/08/2016 08:35 AM   TRIG 163 (H) 01/08/2016 08:35 AM   HDL 39 (L) 01/08/2016 08:35 AM       ASSESSMENT AND PLAN:  1. CAD: Stable ischemic heart disease. Continue aspirin, ramipril, Toprol-XL, and Lipitor.  2. Essential HTN: Controlled. No changes to therapy.  3. Hyperlipidemia: I will obtain a copy of lipids from his PCP as well as HbA1c and basic metabolic panel. Continue Lipitor 20 mg.  4.Elevated HbA1c: 6.6% is consistent with type 2 diabetes. He is trying to address this with dieting and exercising. He has already lost 4.5 pounds. I encouraged him   Disposition: Follow up 1 year  Kate Sable, M.D., F.A.C.C.

## 2017-01-16 ENCOUNTER — Telehealth: Payer: Self-pay | Admitting: Cardiovascular Disease

## 2017-01-16 NOTE — Telephone Encounter (Signed)
Pt would like to speak with nurse regarding RX's./ tg

## 2017-01-17 MED ORDER — RAMIPRIL 10 MG PO CAPS: 10.0000 mg | ORAL_CAPSULE | Freq: Every day | ORAL | 3 refills | Status: DC

## 2017-01-17 MED ORDER — METOPROLOL SUCCINATE ER 50 MG PO TB24: ORAL_TABLET | ORAL | 3 refills | Status: DC

## 2017-01-17 MED ORDER — ATORVASTATIN CALCIUM 20 MG PO TABS: 20.0000 mg | ORAL_TABLET | Freq: Every day | ORAL | 3 refills | Status: DC

## 2017-02-01 ENCOUNTER — Other Ambulatory Visit: Payer: Self-pay | Admitting: Cardiovascular Disease

## 2017-02-01 MED ORDER — OMEPRAZOLE 40 MG PO CPDR: 40.0000 mg | DELAYED_RELEASE_CAPSULE | Freq: Every day | ORAL | 1 refills | Status: DC

## 2017-02-01 NOTE — Telephone Encounter (Signed)
escribed refill request omeprazole

## 2017-02-01 NOTE — Telephone Encounter (Signed)
Needs refill on Omeprazole 40mg  [facility] [address] / tg

## 2017-03-22 DIAGNOSIS — I10 Essential (primary) hypertension: Secondary | ICD-10-CM | POA: Diagnosis not present

## 2017-03-28 DIAGNOSIS — I10 Essential (primary) hypertension: Secondary | ICD-10-CM | POA: Diagnosis not present

## 2017-03-28 DIAGNOSIS — K219 Gastro-esophageal reflux disease without esophagitis: Secondary | ICD-10-CM | POA: Diagnosis not present

## 2017-03-28 DIAGNOSIS — E785 Hyperlipidemia, unspecified: Secondary | ICD-10-CM | POA: Diagnosis not present

## 2017-03-28 DIAGNOSIS — I2510 Atherosclerotic heart disease of native coronary artery without angina pectoris: Secondary | ICD-10-CM | POA: Diagnosis not present

## 2017-05-02 DIAGNOSIS — L570 Actinic keratosis: Secondary | ICD-10-CM | POA: Diagnosis not present

## 2017-05-02 DIAGNOSIS — D171 Benign lipomatous neoplasm of skin and subcutaneous tissue of trunk: Secondary | ICD-10-CM | POA: Diagnosis not present

## 2017-05-02 DIAGNOSIS — D485 Neoplasm of uncertain behavior of skin: Secondary | ICD-10-CM | POA: Diagnosis not present

## 2017-05-02 DIAGNOSIS — Z85828 Personal history of other malignant neoplasm of skin: Secondary | ICD-10-CM | POA: Diagnosis not present

## 2017-05-18 ENCOUNTER — Other Ambulatory Visit: Payer: Self-pay | Admitting: Cardiovascular Disease

## 2017-05-19 DIAGNOSIS — R69 Illness, unspecified: Secondary | ICD-10-CM | POA: Diagnosis not present

## 2017-06-21 DIAGNOSIS — E785 Hyperlipidemia, unspecified: Secondary | ICD-10-CM | POA: Diagnosis not present

## 2017-06-27 DIAGNOSIS — E785 Hyperlipidemia, unspecified: Secondary | ICD-10-CM | POA: Diagnosis not present

## 2017-06-27 DIAGNOSIS — E119 Type 2 diabetes mellitus without complications: Secondary | ICD-10-CM | POA: Diagnosis not present

## 2017-06-27 DIAGNOSIS — I10 Essential (primary) hypertension: Secondary | ICD-10-CM | POA: Diagnosis not present

## 2017-06-27 DIAGNOSIS — Z79899 Other long term (current) drug therapy: Secondary | ICD-10-CM | POA: Diagnosis not present

## 2017-06-27 DIAGNOSIS — K219 Gastro-esophageal reflux disease without esophagitis: Secondary | ICD-10-CM | POA: Diagnosis not present

## 2017-07-27 ENCOUNTER — Other Ambulatory Visit: Payer: Self-pay | Admitting: Cardiovascular Disease

## 2017-10-11 DIAGNOSIS — I10 Essential (primary) hypertension: Secondary | ICD-10-CM | POA: Diagnosis not present

## 2017-10-11 DIAGNOSIS — E785 Hyperlipidemia, unspecified: Secondary | ICD-10-CM | POA: Diagnosis not present

## 2017-10-11 DIAGNOSIS — E119 Type 2 diabetes mellitus without complications: Secondary | ICD-10-CM | POA: Diagnosis not present

## 2017-10-16 DIAGNOSIS — Z79899 Other long term (current) drug therapy: Secondary | ICD-10-CM | POA: Diagnosis not present

## 2017-10-16 DIAGNOSIS — I10 Essential (primary) hypertension: Secondary | ICD-10-CM | POA: Diagnosis not present

## 2017-10-16 DIAGNOSIS — K219 Gastro-esophageal reflux disease without esophagitis: Secondary | ICD-10-CM | POA: Diagnosis not present

## 2017-10-16 DIAGNOSIS — R69 Illness, unspecified: Secondary | ICD-10-CM | POA: Diagnosis not present

## 2017-10-16 DIAGNOSIS — E785 Hyperlipidemia, unspecified: Secondary | ICD-10-CM | POA: Diagnosis not present

## 2017-10-19 DIAGNOSIS — R69 Illness, unspecified: Secondary | ICD-10-CM | POA: Diagnosis not present

## 2017-11-10 ENCOUNTER — Other Ambulatory Visit: Payer: Self-pay | Admitting: Cardiovascular Disease

## 2017-12-27 DIAGNOSIS — R69 Illness, unspecified: Secondary | ICD-10-CM | POA: Diagnosis not present

## 2018-01-08 DIAGNOSIS — H26493 Other secondary cataract, bilateral: Secondary | ICD-10-CM | POA: Diagnosis not present

## 2018-01-08 DIAGNOSIS — H26491 Other secondary cataract, right eye: Secondary | ICD-10-CM | POA: Diagnosis not present

## 2018-01-08 DIAGNOSIS — Z961 Presence of intraocular lens: Secondary | ICD-10-CM | POA: Diagnosis not present

## 2018-01-08 DIAGNOSIS — H26492 Other secondary cataract, left eye: Secondary | ICD-10-CM | POA: Diagnosis not present

## 2018-01-09 DIAGNOSIS — R69 Illness, unspecified: Secondary | ICD-10-CM | POA: Diagnosis not present

## 2018-01-10 ENCOUNTER — Other Ambulatory Visit: Payer: Self-pay | Admitting: Cardiovascular Disease

## 2018-01-15 ENCOUNTER — Ambulatory Visit: Payer: Medicare HMO | Admitting: Cardiovascular Disease

## 2018-01-15 ENCOUNTER — Encounter: Payer: Self-pay | Admitting: Cardiovascular Disease

## 2018-01-15 VITALS — BP 124/68 | HR 67 | Ht 71.0 in | Wt 221.0 lb

## 2018-01-15 DIAGNOSIS — E785 Hyperlipidemia, unspecified: Secondary | ICD-10-CM

## 2018-01-15 DIAGNOSIS — I25118 Atherosclerotic heart disease of native coronary artery with other forms of angina pectoris: Secondary | ICD-10-CM

## 2018-01-15 DIAGNOSIS — I252 Old myocardial infarction: Secondary | ICD-10-CM

## 2018-01-15 DIAGNOSIS — I10 Essential (primary) hypertension: Secondary | ICD-10-CM

## 2018-01-15 NOTE — Progress Notes (Signed)
SUBJECTIVE: The patient presents for annual follow-up of coronary artery disease. He sustained an inferior STEMI for which he underwent percutaneous coronary intervention of the RCA in 1996. He had a 50-60% proximal LAD, 30% proximal LAD, 50-60% proximal circumflex.  He also has history of hypertension and hyperlipidemia.   His primary health issues have been related to bilateral Dupuytren's contractures. This has made guitar playing very difficult for him. He is no longer able to play guitar. He has played the same [place] acoustic for over 30 years.  I personally interpreted the ECG performed today in our office which demonstrates sinus rhythm with an occasional PAC, old inferior infarct, and nonspecific ST segment abnormalities with nonspecific IVCD.  The patient denies any symptoms of chest pain, palpitations, shortness of breath, lightheadedness, dizziness, leg swelling, orthopnea, PND, and syncope.  He lost about 18 pounds with cutting out starches and sweets.  He brought his A1c down from 6.6% to 5.3%.      Review of Systems: As per "subjective", otherwise negative.  No Known Allergies  Current Outpatient Medications  Medication Sig Dispense Refill  . aspirin 81 MG tablet Take 81 mg by mouth daily.    Marland Kitchen atorvastatin (LIPITOR) 20 MG tablet TAKE 1 TABLET BY MOUTH EVERY DAY 90 tablet 3  . chlorthalidone (HYGROTON) 25 MG tablet TAKE 1 TABLET BY MOUTH EVERY DAY 90 tablet 1  . fish oil-omega-3 fatty acids 1000 MG capsule Take 1 capsule by mouth 2 (two) times daily.      . metoprolol succinate (TOPROL-XL) 50 MG 24 hr tablet TAKE ONE TABLET BY MOUTH AT BEDTIME. TAKE WITH OR IMMEDIATELY FOLLOWING A MEAL 90 tablet 3  . omeprazole (PRILOSEC) 40 MG capsule TAKE 1 CAPSULE BY MOUTH EVERY DAY 90 capsule 3  . ramipril (ALTACE) 10 MG capsule Take 1 capsule (10 mg total) by mouth daily. 90 capsule 3   No current facility-administered medications for this visit.     Past Medical  History:  Diagnosis Date  . Arteriosclerotic cardiovascular disease (ASCVD)    IMI requiring RCA stent; EF- 50%;repeat RCA stent in 6/96 with residual 50-60% mid LAD & Cx  . Cancer ([place])    skin cancer  . Cholelithiasis 2009   Asymptomatic  . Colonic polyp 2009  . Gastroesophageal reflux disease   . HOH (hard of hearing)   . Hyperlipidemia   . Hypertension   . Myocardial infarction ([place]) 1996  . Peripheral vascular disease (HCC)    decreased distal pulses; asymptomatic  . 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 Trigger finger    Right fifth finger    Past Surgical History:  Procedure Laterality Date  . APPENDECTOMY    . CARDIAC CATHETERIZATION     2 stents  . CATARACT EXTRACTION W/PHACO Right 02/17/2014   Procedure: CATARACT EXTRACTION PHACO AND INTRAOCULAR LENS PLACEMENT (IOC);  Surgeon: Tonny Branch, MD;  Location: [facility];  Service: Ophthalmology;  Laterality: Right;  CDE 6.87  . CATARACT EXTRACTION W/PHACO Left 03/13/2014   Procedure: CATARACT EXTRACTION PHACO AND INTRAOCULAR LENS PLACEMENT (IOC);  Surgeon: Tonny Branch, MD;  Location: [facility];  Service: Ophthalmology;  Laterality: Left;  CDE:  5.42  . COLONOSCOPY W/ POLYPECTOMY  2009  . FINGER TENDON REPAIR Right    pinky and ring finger    Social History   Socioeconomic History  . Marital status: Married    Spouse name: Not on file  .  Number of children: Not on file  . Years of education: Not on file  . Highest education level: Not on file  Occupational History  . Occupation: Retired    Comment: Electrical engineer  Social Needs  . Financial resource strain: Not on file  . Food insecurity:    Worry: Not on file    Inability: Not on file  . Transportation needs:    Medical: Not on file    Non-medical: Not on file  Tobacco Use  . Smoking status: Former Smoker    Packs/day: 1.30    Years: 30.00    Pack years: 39.00    Start date: 09/12/1958    Last attempt to quit: 02/16/1995    Years  since quitting: 22.9  . Smokeless tobacco: Never Used  Substance and Sexual Activity  . Alcohol use: Yes    Alcohol/week: 0.0 oz    Comment: Moderate  . Drug use: No  . Sexual activity: Not on file  Lifestyle  . Physical activity:    Days per week: Not on file    Minutes per session: Not on file  . Stress: Not on file  Relationships  . Social connections:    Talks on phone: Not on file    Gets together: Not on file    Attends religious service: Not on file    Active member of club or organization: Not on file    Attends meetings of clubs or organizations: Not on file    Relationship status: Not on file  . Intimate partner violence:    Fear of current or ex partner: Not on file    Emotionally abused: Not on file    Physically abused: Not on file    Forced sexual activity: Not on file  Other Topics Concern  . Not on file  Social History Narrative   Married     Vitals:   01/15/18 1128  BP: 124/68  Pulse: 67  SpO2: 97%  Weight: 221 lb (100.2 kg)  Height: 5\' 11"  (1.803 m)    Wt Readings from Last 3 Encounters:  01/15/18 221 lb (100.2 kg)  01/04/17 232 lb (105.2 kg)  01/05/16 241 lb (109.3 kg)     PHYSICAL EXAM General: NAD HEENT: Normal. Neck: No JVD, no thyromegaly. Lungs: Clear to auscultation bilaterally with normal respiratory effort. CV: Regular rate and rhythm, normal S1/S2, no S3/S4, no murmur. No pretibial or periankle edema.  No carotid bruit.   Abdomen: Soft, nontender, no distention.  Neurologic: Alert and oriented.  Psych: Normal affect. Skin: Normal. Musculoskeletal: Bilateral Dupuytren's contractures.    ECG: Most recent ECG reviewed.   Labs: Lab Results  Component Value Date/Time   K 4.3 02/10/2014 08:30 AM   BUN 17 02/10/2014 08:30 AM   CREATININE 1.20 02/10/2014 08:30 AM   CREATININE 1.13 12/18/2013 08:30 AM   ALT 21 12/18/2013 08:30 AM   TSH 2.635 01/02/2013 08:25 AM   HGB 14.4 02/10/2014 08:30 AM     Lipids: Lab Results    Component Value Date/Time   LDLCALC 72 01/08/2016 08:35 AM   CHOL 144 01/08/2016 08:35 AM   TRIG 163 (H) 01/08/2016 08:35 AM   HDL 39 (L) 01/08/2016 08:35 AM       ASSESSMENT AND PLAN:  1. CAD: Stable ischemic heart disease. Continue aspirin, ramipril, Toprol-XL, and Lipitor.  2. Essential HTN: Controlled. No changes to therapy.  3. Hyperlipidemia:  I reviewed his labs from January 2019.  LDL was 58 which is  at goal.  Continue Lipitor 20 mg.     Disposition: Follow up 1 year   Kate Sable, M.D., F.A.C.C.

## 2018-01-15 NOTE — Patient Instructions (Addendum)
Your physician wants you to follow-up in:  1 year with Dr.Koneswaran You will receive a reminder letter in the mail two months in advance. If you don't receive a letter, please call our office to schedule the follow-up appointment.    Your physician recommends that you continue on your current medications as directed. Please refer to the Current Medication list given to you today.    If you need a refill on your cardiac medications before your next appointment, please call your pharmacy.      No lab work or tests ordered today.      Thank you for choosing [facility] !        

## 2018-01-18 ENCOUNTER — Other Ambulatory Visit: Payer: Self-pay | Admitting: Cardiovascular Disease

## 2018-03-02 DIAGNOSIS — E119 Type 2 diabetes mellitus without complications: Secondary | ICD-10-CM | POA: Diagnosis not present

## 2018-03-02 DIAGNOSIS — I10 Essential (primary) hypertension: Secondary | ICD-10-CM | POA: Diagnosis not present

## 2018-03-02 DIAGNOSIS — Z79899 Other long term (current) drug therapy: Secondary | ICD-10-CM | POA: Diagnosis not present

## 2018-03-02 DIAGNOSIS — E785 Hyperlipidemia, unspecified: Secondary | ICD-10-CM | POA: Diagnosis not present

## 2018-03-02 DIAGNOSIS — Z13228 Encounter for screening for other metabolic disorders: Secondary | ICD-10-CM | POA: Diagnosis not present

## 2018-03-02 DIAGNOSIS — Z1329 Encounter for screening for other suspected endocrine disorder: Secondary | ICD-10-CM | POA: Diagnosis not present

## 2018-03-02 DIAGNOSIS — Z13820 Encounter for screening for osteoporosis: Secondary | ICD-10-CM | POA: Diagnosis not present

## 2018-03-08 DIAGNOSIS — E785 Hyperlipidemia, unspecified: Secondary | ICD-10-CM | POA: Diagnosis not present

## 2018-03-08 DIAGNOSIS — E039 Hypothyroidism, unspecified: Secondary | ICD-10-CM | POA: Diagnosis not present

## 2018-03-08 DIAGNOSIS — E559 Vitamin D deficiency, unspecified: Secondary | ICD-10-CM | POA: Diagnosis not present

## 2018-03-08 DIAGNOSIS — I10 Essential (primary) hypertension: Secondary | ICD-10-CM | POA: Diagnosis not present

## 2018-03-08 DIAGNOSIS — Z0000 Encounter for general adult medical examination without abnormal findings: Secondary | ICD-10-CM | POA: Diagnosis not present

## 2018-04-18 DIAGNOSIS — L570 Actinic keratosis: Secondary | ICD-10-CM | POA: Diagnosis not present

## 2018-05-06 ENCOUNTER — Other Ambulatory Visit: Payer: Self-pay | Admitting: Cardiovascular Disease

## 2018-05-22 DIAGNOSIS — E785 Hyperlipidemia, unspecified: Secondary | ICD-10-CM | POA: Diagnosis not present

## 2018-05-22 DIAGNOSIS — E559 Vitamin D deficiency, unspecified: Secondary | ICD-10-CM | POA: Diagnosis not present

## 2018-05-22 DIAGNOSIS — E039 Hypothyroidism, unspecified: Secondary | ICD-10-CM | POA: Diagnosis not present

## 2018-05-28 DIAGNOSIS — E785 Hyperlipidemia, unspecified: Secondary | ICD-10-CM | POA: Diagnosis not present

## 2018-05-28 DIAGNOSIS — E559 Vitamin D deficiency, unspecified: Secondary | ICD-10-CM | POA: Diagnosis not present

## 2018-05-28 DIAGNOSIS — Z79899 Other long term (current) drug therapy: Secondary | ICD-10-CM | POA: Diagnosis not present

## 2018-05-28 DIAGNOSIS — E039 Hypothyroidism, unspecified: Secondary | ICD-10-CM | POA: Diagnosis not present

## 2018-05-28 DIAGNOSIS — I10 Essential (primary) hypertension: Secondary | ICD-10-CM | POA: Diagnosis not present

## 2018-06-23 DIAGNOSIS — R69 Illness, unspecified: Secondary | ICD-10-CM | POA: Diagnosis not present

## 2018-07-15 ENCOUNTER — Other Ambulatory Visit: Payer: Self-pay | Admitting: Cardiovascular Disease

## 2018-11-09 ENCOUNTER — Other Ambulatory Visit: Payer: Self-pay

## 2018-11-09 MED ORDER — CHLORTHALIDONE 25 MG PO TABS: 25.0000 mg | ORAL_TABLET | Freq: Every day | ORAL | 3 refills | Status: DC

## 2018-11-09 NOTE — Telephone Encounter (Signed)
Refilled hygroton to [facility] [place] per fax request

## 2018-12-31 ENCOUNTER — Other Ambulatory Visit: Payer: Self-pay | Admitting: Cardiovascular Disease

## 2018-12-31 MED ORDER — METOPROLOL SUCCINATE ER 50 MG PO TB24: ORAL_TABLET | ORAL | 3 refills | Status: DC

## 2018-12-31 MED ORDER — ATORVASTATIN CALCIUM 20 MG PO TABS: 20.0000 mg | ORAL_TABLET | Freq: Every day | ORAL | 3 refills | Status: DC

## 2018-12-31 NOTE — Telephone Encounter (Signed)
° ° °  1. Which medications need to be refilled? (please list name of each medication and dose if known)      1. Atorvastatin 20 mg      2. Metoprolol   2. Which pharmacy/location (including street and city if local pharmacy) is medication to be sent to ?     [facility] [place] [place]    3. Do they need a 30 day or 90 day supply?

## 2019-01-09 ENCOUNTER — Other Ambulatory Visit: Payer: Self-pay

## 2019-01-09 MED ORDER — RAMIPRIL 10 MG PO CAPS: 10.0000 mg | ORAL_CAPSULE | Freq: Every day | ORAL | 3 refills | Status: DC

## 2019-01-09 NOTE — Telephone Encounter (Signed)
[facility] Altace [facility]d [facility]

## 2019-01-24 ENCOUNTER — Ambulatory Visit: Payer: Medicare Other | Admitting: Cardiovascular Disease

## 2019-03-27 ENCOUNTER — Other Ambulatory Visit: Payer: Self-pay

## 2019-03-27 ENCOUNTER — Ambulatory Visit: Payer: Medicare HMO | Admitting: Cardiovascular Disease

## 2019-03-27 ENCOUNTER — Encounter: Payer: Self-pay | Admitting: Cardiovascular Disease

## 2019-03-27 VITALS — BP 142/83 | HR 84 | Temp 97.5°F | Ht 71.0 in | Wt 238.0 lb

## 2019-03-27 DIAGNOSIS — I10 Essential (primary) hypertension: Secondary | ICD-10-CM | POA: Diagnosis not present

## 2019-03-27 DIAGNOSIS — I25118 Atherosclerotic heart disease of native coronary artery with other forms of angina pectoris: Secondary | ICD-10-CM

## 2019-03-27 DIAGNOSIS — E785 Hyperlipidemia, unspecified: Secondary | ICD-10-CM | POA: Diagnosis not present

## 2019-03-27 NOTE — Patient Instructions (Signed)
Medication Instructions:  Your physician recommends that you continue on your current medications as directed. Please refer to the Current Medication list given to you today.   Labwork: I will request labs from pcp   Testing/Procedures: Your physician has requested that you have an echocardiogram. Echocardiography is a painless test that uses sound waves to create images of your heart. It provides your doctor with information about the size and shape of your heart and how well your heart's chambers and valves are working. This procedure takes approximately one hour. There are no restrictions for this procedure.    Follow-Up: Your physician wants you to follow-up in: 1 year.  You will receive a reminder letter in the mail two months in advance. If you don't receive a letter, please call our office to schedule the follow-up appointment.   Any Other Special Instructions Will Be Listed Below (If Applicable).     If you need a refill on your cardiac medications before your next appointment, please call your pharmacy.   

## 2019-03-27 NOTE — Progress Notes (Signed)
SUBJECTIVE: The patient presents for annual follow-up of coronary artery disease. He sustained an inferior STEMI for which he underwent percutaneous coronary intervention of the RCA in 1996. He had a 50-60% proximal LAD, 30% proximal LAD, 50-60% proximal circumflex.  He also has history of hypertension and hyperlipidemia.  He underwent surgery for left Dupuytren's contracture and has been back to playing guitar for the past 2 months.  He has been playing the same [place] acoustic for over 30 years and recently purchased a fender strata [employer].  This is the first time he has been playing like guitar and really enjoys it.  The patient denies any symptoms of chest pain, palpitations, shortness of breath, lightheadedness, dizziness, leg swelling, orthopnea, PND, and syncope.    Review of Systems: As per "subjective", otherwise negative.  No Known Allergies  Current Outpatient Medications  Medication Sig Dispense Refill  . aspirin 81 MG tablet Take 81 mg by mouth daily.    Marland Kitchen atorvastatin (LIPITOR) 20 MG tablet Take 1 tablet (20 mg total) by mouth daily. 90 tablet 3  . chlorthalidone (HYGROTON) 25 MG tablet Take 1 tablet (25 mg total) by mouth daily. 90 tablet 3  . fish oil-omega-3 fatty acids 1000 MG capsule Take 1 capsule by mouth 2 (two) times daily.      . metoprolol succinate (TOPROL-XL) 50 MG 24 hr tablet Take with or immediately following a meal. 90 tablet 3  . omeprazole (PRILOSEC) 40 MG capsule TAKE 1 CAPSULE BY MOUTH EVERY DAY 90 capsule 3  . ramipril (ALTACE) 10 MG capsule Take 1 capsule (10 mg total) by mouth daily. 90 capsule 3   No current facility-administered medications for this visit.     Past Medical History:  Diagnosis Date  . Arteriosclerotic cardiovascular disease (ASCVD)    IMI requiring RCA stent; EF- 50%;repeat RCA stent in 6/96 with residual 50-60% mid LAD & Cx  . Cancer ([place])    skin cancer  . Cholelithiasis 2009   Asymptomatic  . Colonic polyp 2009   . Gastroesophageal reflux disease   . HOH (hard of hearing)   . Hyperlipidemia   . Hypertension   . Myocardial infarction ([place]) 1996  . Peripheral vascular disease (HCC)    decreased distal pulses; asymptomatic  . 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 Trigger finger    Right fifth finger    Past Surgical History:  Procedure Laterality Date  . APPENDECTOMY    . CARDIAC CATHETERIZATION     2 stents  . CATARACT EXTRACTION W/PHACO Right 02/17/2014   Procedure: CATARACT EXTRACTION PHACO AND INTRAOCULAR LENS PLACEMENT (IOC);  Surgeon: Tonny Branch, MD;  Location: [facility];  Service: Ophthalmology;  Laterality: Right;  CDE 6.87  . CATARACT EXTRACTION W/PHACO Left 03/13/2014   Procedure: CATARACT EXTRACTION PHACO AND INTRAOCULAR LENS PLACEMENT (IOC);  Surgeon: Tonny Branch, MD;  Location: [facility];  Service: Ophthalmology;  Laterality: Left;  CDE:  5.42  . COLONOSCOPY W/ POLYPECTOMY  2009  . FINGER TENDON REPAIR Right    pinky and ring finger    Social History   Socioeconomic History  . Marital status: Married    Spouse name: Not on file  . Number of children: Not on file  . Years of education: Not on file  . Highest education level: Not on file  Occupational History  . Occupation: Retired    Comment: Electrical engineer  Social Needs  . Emergency planning/management officer  strain: Not on file  . Food insecurity    Worry: Not on file    Inability: Not on file  . Transportation needs    Medical: Not on file    Non-medical: Not on file  Tobacco Use  . Smoking status: Former Smoker    Packs/day: 1.30    Years: 30.00    Pack years: 39.00    Start date: 09/12/1958    Quit date: 02/16/1995    Years since quitting: 24.1  . Smokeless tobacco: Never Used  Substance and Sexual Activity  . Alcohol use: Not Currently    Alcohol/week: 0.0 standard drinks    Comment: Moderate  . Drug use: No  . Sexual activity: Not on file  Lifestyle  . Physical activity    Days per week:  Not on file    Minutes per session: Not on file  . Stress: Not on file  Relationships  . Social Herbalist on phone: Not on file    Gets together: Not on file    Attends religious service: Not on file    Active member of club or organization: Not on file    Attends meetings of clubs or organizations: Not on file    Relationship status: Not on file  . Intimate partner violence    Fear of current or ex partner: Not on file    Emotionally abused: Not on file    Physically abused: Not on file    Forced sexual activity: Not on file  Other Topics Concern  . Not on file  Social History Narrative   Married     Vitals:   03/27/19 0826  BP: (!) 142/83  Pulse: 84  Temp: (!) 97.5 F (36.4 C)  Weight: 238 lb (108 kg)  Height: 5\' 11"  (1.803 m)    Wt Readings from Last 3 Encounters:  03/27/19 238 lb (108 kg)  01/15/18 221 lb (100.2 kg)  01/04/17 232 lb (105.2 kg)     PHYSICAL EXAM General: NAD HEENT: Normal. Neck: No JVD, no thyromegaly. Lungs: Clear to auscultation bilaterally with normal respiratory effort. CV: Regular rate and rhythm with premature contractions appreciated, normal S1/S2, no S3/S4, no murmur. No pretibial or periankle edema.  No carotid bruit.   Abdomen: Soft, nontender, no distention.  Neurologic: Alert and oriented.  Psych: Normal affect. Skin: Normal. Musculoskeletal: Right Dupuytren contracture    ECG: Reviewed above under Subjective   Labs: Lab Results  Component Value Date/Time   K 4.3 02/10/2014 08:30 AM   BUN 17 02/10/2014 08:30 AM   CREATININE 1.20 02/10/2014 08:30 AM   CREATININE 1.13 12/18/2013 08:30 AM   ALT 21 12/18/2013 08:30 AM   TSH 2.635 01/02/2013 08:25 AM   HGB 14.4 02/10/2014 08:30 AM     Lipids: Lab Results  Component Value Date/Time   LDLCALC 72 01/08/2016 08:35 AM   CHOL 144 01/08/2016 08:35 AM   TRIG 163 (H) 01/08/2016 08:35 AM   HDL 39 (L) 01/08/2016 08:35 AM       ASSESSMENT AND PLAN:  1.  VQQ:VZDGLO ischemic heart disease. Continue aspirin, ramipril, Toprol-XL, and Lipitor. I will order a 2-D echocardiogram with Doppler to evaluate cardiac structure, function, and regional wall motion.  2. 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  Will need further monitoring. No changes to therapy.  3. Hyperlipidemia: I will obtain a copy of most recent lipids from PCP. Continue Lipitor 20 mg.   Disposition: Follow up 1 yr   Kate Sable,  M.D., F.A.C.C.

## 2019-04-02 ENCOUNTER — Ambulatory Visit (HOSPITAL_COMMUNITY)
Admission: RE | Admit: 2019-04-02 | Discharge: 2019-04-02 | Disposition: A | Discharge status: Home / Self Care | Payer: Medicare HMO | Source: Ambulatory Visit | Attending: Cardiovascular Disease | Admitting: Cardiovascular Disease

## 2019-04-02 ENCOUNTER — Other Ambulatory Visit: Payer: Self-pay

## 2019-04-02 DIAGNOSIS — I25118 Atherosclerotic heart disease of native coronary artery with other forms of angina pectoris: Secondary | ICD-10-CM | POA: Diagnosis not present

## 2019-04-02 NOTE — Progress Notes (Signed)
*  PRELIMINARY RESULTS* Echocardiogram 2D Echocardiogram has been performed.  Phillip Preston 04/02/2019, 2:27 PM

## 2019-04-27 ENCOUNTER — Other Ambulatory Visit: Payer: Self-pay | Admitting: Cardiovascular Disease

## 2019-07-09 ENCOUNTER — Other Ambulatory Visit: Payer: Self-pay

## 2019-07-09 MED ORDER — OMEPRAZOLE 40 MG PO CPDR: DELAYED_RELEASE_CAPSULE | ORAL | 0 refills | Status: DC

## 2019-07-09 NOTE — Telephone Encounter (Signed)
Refilled omeprazole.

## 2019-10-06 ENCOUNTER — Other Ambulatory Visit: Payer: Self-pay | Admitting: Cardiovascular Disease

## 2019-10-30 ENCOUNTER — Other Ambulatory Visit: Payer: Self-pay | Admitting: Cardiovascular Disease

## 2019-11-02 ENCOUNTER — Other Ambulatory Visit: Payer: Self-pay | Admitting: Cardiovascular Disease

## 2019-12-31 ENCOUNTER — Other Ambulatory Visit: Payer: Self-pay | Admitting: Cardiovascular Disease

## 2020-01-01 ENCOUNTER — Other Ambulatory Visit: Payer: Self-pay | Admitting: Cardiovascular Disease

## 2020-01-03 ENCOUNTER — Other Ambulatory Visit: Payer: Self-pay | Admitting: Cardiovascular Disease

## 2020-03-26 ENCOUNTER — Other Ambulatory Visit: Payer: Self-pay | Admitting: Student

## 2020-03-26 MED ORDER — OMEPRAZOLE 40 MG PO CPDR: 40.0000 mg | DELAYED_RELEASE_CAPSULE | Freq: Every day | ORAL | 2 refills | Status: DC

## 2020-06-08 ENCOUNTER — Ambulatory Visit: Payer: Medicare HMO | Admitting: Physician Assistant

## 2020-06-08 ENCOUNTER — Other Ambulatory Visit: Payer: Self-pay

## 2020-06-08 ENCOUNTER — Encounter: Payer: Self-pay | Admitting: Physician Assistant

## 2020-06-08 VITALS — BP 142/80 | HR 110 | Ht 71.0 in | Wt 231.0 lb

## 2020-06-08 DIAGNOSIS — Z23 Encounter for immunization: Secondary | ICD-10-CM

## 2020-06-08 DIAGNOSIS — I10 Essential (primary) hypertension: Secondary | ICD-10-CM

## 2020-06-08 DIAGNOSIS — E785 Hyperlipidemia, unspecified: Secondary | ICD-10-CM | POA: Diagnosis not present

## 2020-06-08 DIAGNOSIS — I2510 Atherosclerotic heart disease of native coronary artery without angina pectoris: Secondary | ICD-10-CM

## 2020-06-08 DIAGNOSIS — R000 Tachycardia, unspecified: Secondary | ICD-10-CM

## 2020-06-08 NOTE — Patient Instructions (Addendum)
Medication Instructions:   Please take an extra 1/2 tablet of Toprol when get home.  Office will contact with results via phone or letter.    Labwork: none  Testing/Procedures: none  Follow-Up: 6 months   Any Other Special Instructions Will Be Listed Below (If Applicable).  Decrease caffeine intake.  Nurse visit for vitals end of this week.  If you need a refill on your cardiac medications before your next appointment, please call your pharmacy.

## 2020-06-08 NOTE — Progress Notes (Signed)
Cardiology Office Note    Date:  06/08/2020   ID:  Phillip, Preston 01/07/1946, MRN [id number]  PCP:  Jani Gravel, MD  Cardiologist: Kate Sable, MD (Inactive) EPS: None  No chief complaint on file.   History of Present Illness:  Phillip Preston is a 74 y.o. male with history of CAD status post inferior STEMI treated with PCI of the RCA 1996 with residual disease in the LAD and circumflex treated medically, hypertension, hyperlipidemia.  Last seen in our office 03/2019 and echo updated showed normal LV function EF 55 to 60% with impaired relaxation, mild aortic insufficiency.  Patient comes in for yearly f/u. Starting to exercise again since cooler weather. Walking 2 miles daily, dances when he can. HR up today. Took Toprol last night. Drank 24 ounces coffee today, usually only drinks 12 oucnes daily.  No over-the-counter medications or other caffeine source.  He is asymptomatic and does not feel his heart rate of 110 bpm.   Past Medical History:  Diagnosis Date  . Arteriosclerotic cardiovascular disease (ASCVD)    IMI requiring RCA stent; EF- 50%;repeat RCA stent in 6/96 with residual 50-60% mid LAD & Cx  . Cancer ([place])    skin cancer  . Cholelithiasis 2009   Asymptomatic  . Colonic polyp 2009  . Gastroesophageal reflux disease   . HOH (hard of hearing)   . Hyperlipidemia   . Hypertension   . Myocardial infarction ([place]) 1996  . Peripheral vascular disease (HCC)    decreased distal pulses; asymptomatic  . 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 Trigger finger    Right fifth finger    Past Surgical History:  Procedure Laterality Date  . APPENDECTOMY    . CARDIAC CATHETERIZATION     2 stents  . CATARACT EXTRACTION W/PHACO Right 02/17/2014   Procedure: CATARACT EXTRACTION PHACO AND INTRAOCULAR LENS PLACEMENT (IOC);  Surgeon: Tonny Branch, MD;  Location: [facility];  Service: Ophthalmology;  Laterality: Right;  CDE 6.87  . CATARACT EXTRACTION  W/PHACO Left 03/13/2014   Procedure: CATARACT EXTRACTION PHACO AND INTRAOCULAR LENS PLACEMENT (IOC);  Surgeon: Tonny Branch, MD;  Location: [facility];  Service: Ophthalmology;  Laterality: Left;  CDE:  5.42  . COLONOSCOPY W/ POLYPECTOMY  2009  . FINGER TENDON REPAIR Right    pinky and ring finger    Current Medications: Current Meds  Medication Sig  . aspirin 81 MG tablet Take 81 mg by mouth daily.  Marland Kitchen atorvastatin (LIPITOR) 20 MG tablet TAKE 1 TABLET BY MOUTH EVERY DAY  . chlorthalidone (HYGROTON) 25 MG tablet TAKE 1 TABLET BY MOUTH EVERY DAY  . fish oil-omega-3 fatty acids 1000 MG capsule Take 1 capsule by mouth 2 (two) times daily.    . metoprolol succinate (TOPROL-XL) 50 MG 24 hr tablet TAKE AS DIRECTED WITH OR IMMEDIATELY FOLLOWING A MEAL.  Marland Kitchen omeprazole (PRILOSEC) 40 MG capsule Take 1 capsule (40 mg total) by mouth daily.  . ramipril (ALTACE) 10 MG capsule TAKE 1 CAPSULE BY MOUTH EVERY DAY     Allergies:   Patient has no known allergies.   Social History   Socioeconomic History  . Marital status: Married    Spouse name: Not on file  . Number of children: Not on file  . Years of education: Not on file  . Highest education level: Not on file  Occupational History  . Occupation: Retired    Comment: Electrical engineer  Tobacco Use  .  Smoking status: Former Smoker    Packs/day: 1.30    Years: 30.00    Pack years: 39.00    Start date: 09/12/1958    Quit date: 02/16/1995    Years since quitting: 25.3  . Smokeless tobacco: Never Used  Vaping Use  . Vaping Use: Never used  Substance and Sexual Activity  . Alcohol use: Not Currently    Alcohol/week: 0.0 standard drinks    Comment: Moderate  . Drug use: No  . Sexual activity: Not on file  Other Topics Concern  . Not on file  Social History Narrative   Married   Social Determinants of Health   Financial Resource Strain:   . Difficulty of Paying Living Expenses: Not on file  Food Insecurity:   . Worried About Sales executive in the Last Year: Not on file  . Ran Out of Food in the Last Year: Not on file  Transportation Needs:   . Lack of Transportation (Medical): Not on file  . Lack of Transportation (Non-Medical): Not on file  Physical Activity:   . Days of Exercise per Week: Not on file  . Minutes of Exercise per Session: Not on file  Stress:   . Feeling of Stress : Not on file  Social Connections:   . Frequency of Communication with Friends and Family: Not on file  . Frequency of Social Gatherings with Friends and Family: Not on file  . Attends Religious Services: Not on file  . Active Member of Clubs or Organizations: Not on file  . Attends Archivist Meetings: Not on file  . Marital Status: Not on file     Family History:  The patient's family history includes Cancer in his father.   ROS:   Please see the history of present illness.    ROS All other systems reviewed and are negative.   PHYSICAL EXAM:   VS:  BP (!) 142/80   Pulse (!) 110   Ht 5\' 11"  (1.803 m)   Wt 231 lb (104.8 kg)   SpO2 96%   BMI 32.22 kg/m   Physical Exam  GEN: Obese, in no acute distress  Neck: no JVD, carotid bruits, or masses Cardiac:RRR; no murmurs, rubs, or gallops  Respiratory:  clear to auscultation bilaterally, normal work of breathing GI: soft, nontender, nondistended, + BS Ext: without cyanosis, clubbing, or edema, Good distal pulses bilaterally MS: no deformity or atrophy  Skin: warm and dry, no rash Neuro:  Alert and Oriented x 3 Psych: euthymic mood, full affect  Wt Readings from Last 3 Encounters:  06/08/20 231 lb (104.8 kg)  03/27/19 238 lb (108 kg)  01/15/18 221 lb (100.2 kg)      Studies/Labs Reviewed:   EKG:  EKG is  ordered today.  The ekg ordered today demonstrates sinus tachycardia at 110 bpm with PACs old inferior lateral infarct, no acute change from prior tracings other than the sinus tachycardia  Recent Labs: No results found for requested labs within last 8760 hours.    Lipid Panel    Component Value Date/Time   CHOL 144 01/08/2016 0835   TRIG 163 (H) 01/08/2016 0835   HDL 39 (L) 01/08/2016 0835   CHOLHDL 3.7 01/08/2016 0835   VLDL 33 (H) 01/08/2016 0835   LDLCALC 72 01/08/2016 0835    Additional studies/ records that were reviewed today include:  2D echo 04/02/2019 IMPRESSIONS     1. The left ventricle has normal systolic function, with an ejection  fraction of 55-60%. The cavity size was normal. There is mildly increased  left ventricular wall thickness. Left ventricular diastolic Doppler  parameters are consistent with impaired  relaxation. Indeterminate filling pressures.   2. The right ventricle has normal systolic function. The cavity was  normal. There is no increase in right ventricular wall thickness.   3. No evidence of mitral valve stenosis.   4. The aortic valve is tricuspid. Aortic valve regurgitation is mild by  color flow Doppler. No stenosis of the aortic valve.   5. The aortic root is normal in size and structure.   6. Pulmonary hypertension is indeterminant, inadequate TR jet.   7. The interatrial septum was not well visualized.   FINDINGS   Left Ventricle: The left ventricle has normal systolic function, with an  ejection fraction of 55-60%. The cavity size was normal. There is mildly  increased left ventricular wall thickness. Left ventricular diastolic  Doppler parameters are consistent  with impaired relaxation. Indeterminate filling pressures   Right Ventricle: The right ventricle has normal systolic function. The  cavity was normal. There is no increase in right ventricular wall  thickness.   Left Atrium: Left atrial size was normal in size.   Right Atrium: Right atrial size was normal in size. Right atrial pressure  is estimated at 10 mmHg.   Interatrial Septum: The interatrial septum was not well visualized.   Pericardium: There is no evidence of pericardial effusion.   Mitral Valve: The mitral valve is  normal in structure. Mitral valve  regurgitation is trivial by color flow Doppler. No evidence of mitral  valve stenosis.   Tricuspid Valve: The tricuspid valve is normal in structure. Tricuspid  valve regurgitation is trivial by color flow Doppler.   Aortic Valve: The aortic valve is tricuspid Aortic valve regurgitation is  mild by color flow Doppler. There is No stenosis of the aortic valve, with  a calculated valve area of 2.68 cm.   Pulmonic Valve: The pulmonic valve was not well visualized. Pulmonic valve  regurgitation is not visualized by color flow Doppler. No evidence of  pulmonic stenosis.   Aorta: The aortic root is normal in size and structure.   Pulmonary Artery: Pulmonary hypertension is indeterminant, inadequate TR  jet.   Venous: The inferior vena cava was not well visualized.      ASSESSMENT:    1. Coronary artery disease involving native coronary artery of native heart without angina pectoris   2. Sinus tachycardia   3. Essential hypertension   4. Hyperlipidemia, unspecified hyperlipidemia type      PLAN:  In order of problems listed above:  CAD status post inferior STEMI treated with PCI of the RCA 1996 residual 50 to 60% proximal LAD, 30% proximal LAD, 50 to 60% proximal circumflex-no angina, start exercising again.  Just had blood work by PCP.  Will try to obtain copy  Sinus tachycardia at 110 bpm.  Patient asymptomatic.  Had 24 ounces of coffee today.  Of asked him to take an extra half of his Toprol 50 mg tablet when he gets home.  Decrease caffeine intake.  Return to the office Thursday or Friday for a nurse check on his heart rate and blood pressure  Essential hypertension blood pressure up a little  Hyperlipidemia on Lipitor recent blood work done by PCP    Medication Adjustments/Labs and Tests Ordered: Current medicines are reviewed at length with the patient today.  Concerns regarding medicines are outlined above.  Medication changes, Labs  and Tests ordered today are listed in the Patient Instructions below. Patient Instructions  Medication Instructions:   Please take an extra 1/2 tablet of Toprol when get home.  Office will contact with results via phone or letter.    Labwork: none  Testing/Procedures: none  Follow-Up: 6 months   Any Other Special Instructions Will Be Listed Below (If Applicable).  Decrease caffeine intake.  Nurse visit for vitals end of this week.  If you need a refill on your cardiac medications before your next appointment, please call your pharmacy.     Signed, Ermalinda Barrios, PA-C  06/08/2020 2:28 PM    [facility] [address], [place], [place]  [address] Phone: [phone]; Fax: [phone]

## 2020-06-12 ENCOUNTER — Ambulatory Visit (INDEPENDENT_AMBULATORY_CARE_PROVIDER_SITE_OTHER): Payer: Medicare HMO

## 2020-06-12 ENCOUNTER — Other Ambulatory Visit: Payer: Self-pay

## 2020-06-12 VITALS — BP 140/76 | HR 87

## 2020-06-12 DIAGNOSIS — Z0130 Encounter for examination of blood pressure without abnormal findings: Secondary | ICD-10-CM

## 2020-06-12 NOTE — Progress Notes (Signed)
Patient came for HR/BP check today. He states he feels fine, BP 140/76, HR 87   Will forward to M.Bonnell Public, PA-C

## 2020-12-14 ENCOUNTER — Telehealth: Payer: Self-pay

## 2020-12-14 MED ORDER — METOPROLOL SUCCINATE ER 50 MG PO TB24: ORAL_TABLET | ORAL | 0 refills | Status: DC

## 2020-12-14 NOTE — Telephone Encounter (Signed)
Medication refill request for Metoprolol Succ. ER 50 mg tablets approved and sent to [facility]. Patient needs a follow up appointment with Cardiology (est. With MD) for further refills.

## 2020-12-16 ENCOUNTER — Telehealth: Payer: Self-pay | Admitting: Cardiology

## 2020-12-16 ENCOUNTER — Other Ambulatory Visit: Payer: Self-pay | Admitting: Student

## 2020-12-16 MED ORDER — ATORVASTATIN CALCIUM 20 MG PO TABS: 1.0000 | ORAL_TABLET | Freq: Every day | ORAL | 0 refills | Status: DC

## 2020-12-16 MED ORDER — CHLORTHALIDONE 25 MG PO TABS: 25.0000 mg | ORAL_TABLET | Freq: Every day | ORAL | 0 refills | Status: DC

## 2020-12-16 MED ORDER — RAMIPRIL 10 MG PO CAPS: ORAL_CAPSULE | ORAL | 0 refills | Status: DC

## 2020-12-16 MED ORDER — OMEPRAZOLE 40 MG PO CPDR: 40.0000 mg | DELAYED_RELEASE_CAPSULE | Freq: Every day | ORAL | 0 refills | Status: DC

## 2020-12-16 NOTE — Telephone Encounter (Signed)
Medication refill request approved and sent to pharmacy. Patient needs follow up appointment for further refills.

## 2020-12-16 NOTE — Telephone Encounter (Signed)
*  STAT* If patient is at the pharmacy, call can be transferred to refill team.   1. Which medications need to be refilled? (please list name of each medication and dose if known)   Atorvastatin 20mg , Metoprolol succinate (TOPROL-XL) 50 MG, Chlorthalidone (HYGROTON) 25mg , Ramipril (ALTACE) 10 MG, Omeprazole (PRILOSEC) 40 MG   2. Which pharmacy/location (including street and city if local pharmacy) is medication to be sent to? [facility] [address]  3. Do they need a 30 day or 90 day supply? [place]

## 2021-01-07 ENCOUNTER — Other Ambulatory Visit: Payer: Self-pay

## 2021-01-07 ENCOUNTER — Other Ambulatory Visit: Payer: Self-pay | Admitting: Physician Assistant

## 2021-01-07 MED ORDER — METOPROLOL SUCCINATE ER 50 MG PO TB24: 50.0000 mg | ORAL_TABLET | Freq: Every day | ORAL | 1 refills | Status: DC

## 2021-01-07 NOTE — Telephone Encounter (Signed)
Pt's medication directions are missing on this Rx. Please resend with the directions on how pt is supposed to be taking this medication. Please address

## 2021-01-07 NOTE — Telephone Encounter (Signed)
Refilled toprol xl 50 mg daily at bedtime #90 RF:1

## 2021-01-12 ENCOUNTER — Ambulatory Visit (INDEPENDENT_AMBULATORY_CARE_PROVIDER_SITE_OTHER): Payer: Medicare HMO | Admitting: Cardiology

## 2021-01-12 ENCOUNTER — Encounter: Payer: Self-pay | Admitting: Cardiology

## 2021-01-12 ENCOUNTER — Other Ambulatory Visit: Payer: Self-pay

## 2021-01-12 VITALS — BP 152/84 | HR 92 | Ht 71.0 in | Wt 230.0 lb

## 2021-01-12 DIAGNOSIS — I10 Essential (primary) hypertension: Secondary | ICD-10-CM

## 2021-01-12 DIAGNOSIS — E782 Mixed hyperlipidemia: Secondary | ICD-10-CM

## 2021-01-12 DIAGNOSIS — I2510 Atherosclerotic heart disease of native coronary artery without angina pectoris: Secondary | ICD-10-CM

## 2021-01-12 MED ORDER — ATORVASTATIN CALCIUM 20 MG PO TABS: 1.0000 | ORAL_TABLET | Freq: Every day | ORAL | 3 refills | Status: DC

## 2021-01-12 MED ORDER — RAMIPRIL 10 MG PO CAPS: ORAL_CAPSULE | ORAL | 3 refills | Status: DC

## 2021-01-12 MED ORDER — METOPROLOL SUCCINATE ER 50 MG PO TB24: 50.0000 mg | ORAL_TABLET | Freq: Every day | ORAL | 3 refills | Status: DC

## 2021-01-12 MED ORDER — CHLORTHALIDONE 25 MG PO TABS: 25.0000 mg | ORAL_TABLET | Freq: Every day | ORAL | 3 refills | Status: DC

## 2021-01-12 NOTE — Patient Instructions (Signed)
Medication Instructions:  Your physician recommends that you continue on your current medications as directed. Please refer to the Current Medication list given to you today.  *If you need a refill on your cardiac medications before your next appointment, please call your pharmacy*   Lab Work: Lab work requested from [facility] If you have labs (blood work) drawn today and your tests are completely normal, you will receive your results only by: Marland Kitchen MyChart Message (if you have MyChart) OR . A paper copy in the mail If you have any lab test that is abnormal or we need to change your treatment, we will call you to review the results.   Testing/Procedures: None   Follow-Up: At [facility], you and your health needs are our priority.  As part of our continuing mission to provide you with exceptional heart care, we have created designated Provider Care Teams.  These Care Teams include your primary Cardiologist (physician) and Advanced Practice Providers (APPs -  Physician Assistants and Nurse Practitioners) who all work together to provide you with the care you need, when you need it.  We recommend signing up for the patient portal called "MyChart".  Sign up information is provided on this After Visit Summary.  MyChart is used to connect with patients for Virtual Visits (Telemedicine).  Patients are able to view lab/test results, encounter notes, upcoming appointments, etc.  Non-urgent messages can be sent to your provider as well.   To learn more about what you can do with MyChart, go to [email or web address].    Your next appointment:   12 month(s)  The format for your next appointment:   In Person  Provider:   Carlyle Dolly, MD   Other Instructions

## 2021-01-12 NOTE — Progress Notes (Signed)
Clinical Summary [name] is a 75 y.o.male former patient of Dr Phillip Preston, this is our first visit toghether. Seen for the following medical problems.  1. CAD - prior inferior MI in 1996, received DES - no recent chest pain, no SOB/DOE - compliant with meds   2. HTN - home bp's 130s/70s - compliant with meds   3. 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 Arteriosclerotic cardiovascular disease (ASCVD)    IMI requiring RCA stent; EF- 50%;repeat RCA stent in 6/96 with residual 50-60% mid LAD & Cx  . Cancer ([place])    skin cancer  . Cholelithiasis 2009   Asymptomatic  . Colonic polyp 2009  . Gastroesophageal reflux disease   . HOH (hard of hearing)   . Hyperlipidemia   . Hypertension   . Myocardial infarction ([place]) 1996  . Peripheral vascular disease (HCC)    decreased distal pulses; asymptomatic  . 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 Trigger finger    Right fifth finger     No Known Allergies   Current Outpatient Medications  Medication Sig Dispense Refill  . aspirin 81 MG tablet Take 81 mg by mouth daily.    Marland Kitchen atorvastatin (LIPITOR) 20 MG tablet Take 1 tablet (20 mg total) by mouth daily. 30 tablet 0  . chlorthalidone (HYGROTON) 25 MG tablet Take 1 tablet (25 mg total) by mouth daily. 30 tablet 0  . fish oil-omega-3 fatty acids 1000 MG capsule Take 1 capsule by mouth 2 (two) times daily.      . metoprolol succinate (TOPROL-XL) 50 MG 24 hr tablet Take 1 tablet (50 mg total) by mouth at bedtime. Take with or immediately following a meal. 90 tablet 1  . omeprazole (PRILOSEC) 40 MG capsule Take 1 capsule (40 mg total) by mouth daily. 30 capsule 0  . ramipril (ALTACE) 10 MG capsule TAKE 1 CAPSULE BY MOUTH EVERY DAY 30 capsule 0   No  current facility-administered medications for this visit.     Past Surgical History:  Procedure Laterality Date  . APPENDECTOMY    . CARDIAC CATHETERIZATION     2 stents  . CATARACT EXTRACTION W/PHACO Right 02/17/2014   Procedure: CATARACT EXTRACTION PHACO AND INTRAOCULAR LENS PLACEMENT (IOC);  Surgeon: Phillip Carnelius Hammitt, MD;  Location: [facility];  Service: Ophthalmology;  Laterality: Right;  CDE 6.87  . CATARACT EXTRACTION W/PHACO Left 03/13/2014   Procedure: CATARACT EXTRACTION PHACO AND INTRAOCULAR LENS PLACEMENT (IOC);  Surgeon: Phillip Talah Cookston, MD;  Location: [facility];  Service: Ophthalmology;  Laterality: Left;  CDE:  5.42  . COLONOSCOPY W/ POLYPECTOMY  2009  . FINGER TENDON REPAIR Right    pinky and ring finger     No Known Allergies    Family History  Problem Relation Age of Onset  . Cancer Father      Social History [name] reports that he quit smoking about 25 years ago. He started smoking about 62 years ago. He has a 39.00 pack-year smoking history. He has never used smokeless tobacco. [name] reports previous alcohol use.   Review of Systems CONSTITUTIONAL: No weight loss, fever, chills, weakness or fatigue.  HEENT: Eyes: No visual loss, blurred vision, double vision or yellow sclerae.No  hearing loss, sneezing, congestion, runny nose or sore throat.  SKIN: No rash or itching.  CARDIOVASCULAR: per hpi RESPIRATORY: No shortness of breath, cough or sputum.  GASTROINTESTINAL: No anorexia, nausea, vomiting or diarrhea. No abdominal pain or blood.  GENITOURINARY: No burning on urination, no polyuria NEUROLOGICAL: No headache, dizziness, syncope, paralysis, ataxia, numbness or tingling in the extremities. No change in bowel or bladder control.  MUSCULOSKELETAL: No muscle, back pain, joint pain or stiffness.  LYMPHATICS: No enlarged nodes. No history of splenectomy.  PSYCHIATRIC: No history of depression or anxiety.  ENDOCRINOLOGIC: No reports of sweating, cold or heat intolerance.  No polyuria or polydipsia.  Marland Kitchen   Physical Examination Today's Vitals   01/12/21 0906  BP: (!) 152/84  Pulse: 92  SpO2: 97%  Weight: 230 lb (104.3 kg)  Height: 5\' 11"  (1.803 m)   Body mass index is 32.08 kg/m.  Gen: resting comfortably, no acute distress HEENT: no scleral icterus, pupils equal round and reactive, no palptable cervical adenopathy,  CV: RRR, no m/r/g no jvd Resp: Clear to auscultation bilaterally GI: abdomen is soft, non-tender, non-distended, normal bowel sounds, no hepatosplenomegaly MSK: extremities are warm, no edema.  Skin: warm, no rash Neuro:  no focal deficits Psych: appropriate affect   Diagnostic Studies  03/2019 echo IMPRESSIONS    1. The left ventricle has normal systolic function, with an ejection  fraction of 55-60%. The cavity size was normal. There is mildly increased  left ventricular wall thickness. Left ventricular diastolic Doppler  parameters are consistent with impaired  relaxation. Indeterminate filling pressures.  2. The right ventricle has normal systolic function. The cavity was  normal. There is no increase in right ventricular wall thickness.  3. No evidence of mitral valve stenosis.  4. The aortic valve is tricuspid. Aortic valve regurgitation is mild by  color flow Doppler. No stenosis of the aortic valve.  5. The aortic root is normal in size and structure.  6. Pulmonary hypertension is indeterminant, inadequate TR jet.  7. The interatrial septum was not well visualized.      Assessment and Plan  1. CAD - no recent symptoms, continue current meds  2. HTN - elevated in clinic but home numbers at goal, continue current meds  3. Hyperlipidemia - request pcp labs, continue statin  F/u 1 year      Phillip Preston, M.D.

## 2021-01-13 ENCOUNTER — Other Ambulatory Visit: Payer: Self-pay | Admitting: Physician Assistant

## 2021-02-03 ENCOUNTER — Other Ambulatory Visit: Payer: Self-pay | Admitting: Physician Assistant

## 2021-02-09 ENCOUNTER — Telehealth: Payer: Self-pay | Admitting: Cardiology

## 2021-02-09 MED ORDER — ATORVASTATIN CALCIUM 20 MG PO TABS: 1.0000 | ORAL_TABLET | Freq: Every day | ORAL | 3 refills | Status: DC

## 2021-02-09 MED ORDER — OMEPRAZOLE 40 MG PO CPDR: 1.0000 | DELAYED_RELEASE_CAPSULE | Freq: Every day | ORAL | 3 refills | Status: DC

## 2021-02-09 MED ORDER — RAMIPRIL 10 MG PO CAPS: ORAL_CAPSULE | ORAL | 3 refills | Status: DC

## 2021-02-09 NOTE — Telephone Encounter (Signed)
All of patients medications was to be called in to the [facility] in [place].  The pharmacy states they do not have the refills.  Please review medication refills that were done and send to pharmacy as soon as possible.

## 2021-02-09 NOTE — Telephone Encounter (Signed)
Refills sent to [facility] in [place]

## 2021-04-15 ENCOUNTER — Telehealth: Payer: Self-pay | Admitting: Cardiology

## 2021-04-15 MED ORDER — METOPROLOL SUCCINATE ER 50 MG PO TB24: 50.0000 mg | ORAL_TABLET | Freq: Every day | ORAL | 3 refills | Status: DC

## 2021-04-15 NOTE — Telephone Encounter (Signed)
*  STAT* If patient is at the pharmacy, call can be transferred to refill team.   1. Which medications need to be refilled? (please list name of each medication and dose if known) metoprolol succinate (TOPROL-XL) 50 MG 24 hr tablet   2. Which pharmacy/location (including street and city if local pharmacy) is medication to be sent to? [place]  3. Do they need a 30 day or 90 day supply? [place]

## 2021-04-15 NOTE — Telephone Encounter (Signed)
Medication refill request for Metoprolol Succinate 50 mg tablets approved and sent to [facility] per pt request.

## 2021-07-21 ENCOUNTER — Encounter: Payer: Self-pay | Admitting: Cardiology

## 2021-12-01 ENCOUNTER — Telehealth: Payer: Self-pay | Admitting: Cardiology

## 2021-12-01 NOTE — Telephone Encounter (Signed)
? ?  Pre-operative Risk Assessment  ?  ?Patient Name: Phillip Preston  ?DOB: [birth year] ?MRN: [id number]  ? ? ? ?Request for Surgical Clearance   ? ?Procedure:  Dental Extraction - Amount of Teeth to be Pulled:  1, tooth #20 ? ?Date of Surgery:  Clearance 12/01/21                              ?   ?Surgeon:  Durene Cal  ?Surgeon's Group or Practice Name:  [facility]  ?Phone number:  [phone] ?Fax number:  [phone] ?  ?Type of Clearance Requested:   ?- Medical  ?- Pharmacy:  Hold    TBD by cardiology ?  ?Type of Anesthesia:  Local  ?  ?Additional requests/questions:  Does this patient need antibiotics? ?Please advise surgeon/provider what medications should be held. ?Patient is in chair now  ? ?Signed, ?Trilby Drummer   ?12/01/2021, 9:20 AM   ?

## 2021-12-01 NOTE — Telephone Encounter (Signed)
? ?  Primary Cardiologist: Kate Sable, MD (Inactive) ? ?Chart reviewed as part of pre-operative protocol coverage. Simple dental extractions are considered low risk procedures per guidelines and generally do not require any specific cardiac clearance. It is also generally accepted that for simple extractions and dental cleanings, there is no need to interrupt blood thinner therapy.  ? ?SBE prophylaxis is not required for the patient. ? ?I will route this recommendation to the requesting party via Epic fax function and remove from pre-op pool. ? ?Please call with questions. ? ?Deberah Pelton, NP ?12/01/2021, 9:36 AM ? ? ? ? ?

## 2022-01-31 ENCOUNTER — Other Ambulatory Visit: Payer: Self-pay | Admitting: Cardiology

## 2022-02-03 ENCOUNTER — Other Ambulatory Visit: Payer: Self-pay | Admitting: Cardiology

## 2022-02-06 ENCOUNTER — Other Ambulatory Visit: Payer: Self-pay | Admitting: Cardiology

## 2022-04-01 ENCOUNTER — Ambulatory Visit: Payer: Medicare HMO | Admitting: Cardiology

## 2022-04-01 ENCOUNTER — Encounter: Payer: Self-pay | Admitting: Cardiology

## 2022-04-01 ENCOUNTER — Encounter: Payer: Self-pay | Admitting: *Deleted

## 2022-04-01 VITALS — BP 148/90 | HR 84 | Ht 71.0 in | Wt 228.0 lb

## 2022-04-01 DIAGNOSIS — E782 Mixed hyperlipidemia: Secondary | ICD-10-CM | POA: Diagnosis not present

## 2022-04-01 DIAGNOSIS — I2510 Atherosclerotic heart disease of native coronary artery without angina pectoris: Secondary | ICD-10-CM

## 2022-04-01 DIAGNOSIS — I10 Essential (primary) hypertension: Secondary | ICD-10-CM | POA: Diagnosis not present

## 2022-04-01 NOTE — Progress Notes (Signed)
Clinical Summary Phillip Preston is a 76 y.o.male   1. CAD - prior inferior MI in 1996, received DES - no chest pains, no SOB/DOE - compliant with meds     2. HTN - home bp's 130s/80s - compliant with meds, has not taken meds yet     3. Hyperlipidemia - labs by pcp, labs 6 months ago - he is on atorvastain '20mg'$  daily.            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, country, blues     Past Medical History:  Diagnosis Date   Arteriosclerotic cardiovascular disease (ASCVD)    IMI requiring RCA stent; EF- 50%;repeat RCA stent in 6/96 with residual 50-60% mid LAD & Cx   Cancer [facility])    skin cancer   Cholelithiasis 2009   Asymptomatic   Colonic polyp 2009   Gastroesophageal reflux disease    HOH (hard of hearing)    Hyperlipidemia    Hypertension    Myocardial infarction [facility]) 1996   Peripheral vascular disease ([place])    decreased distal pulses; asymptomatic   Tobacco abuse, 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     atorvastatin (LIPITOR) 20 MG tablet Take 1 tablet by mouth once daily 90 tablet 1   chlorthalidone (HYGROTON) 25 MG tablet Take 1 tablet by mouth once daily 90 tablet 1   fish oil-omega-3 fatty acids 1000 MG capsule Take 1 capsule by mouth 2 (two) times daily.     metoprolol succinate (TOPROL-XL) 50 MG 24 hr tablet Take 1 tablet (50 mg total) by mouth at bedtime. Take with or immediately following a meal. 90 tablet 3   omeprazole (PRILOSEC) 40 MG capsule Take 1 capsule by mouth once daily 90 capsule 1   ramipril (ALTACE) 10 MG capsule Take 1 capsule by mouth once daily 90 capsule 1   No current facility-administered medications for this visit.     Past Surgical History:  Procedure  Laterality Date   APPENDECTOMY     CARDIAC CATHETERIZATION     2 stents   CATARACT EXTRACTION W/PHACO Right 02/17/2014   Procedure: CATARACT EXTRACTION PHACO AND INTRAOCULAR LENS PLACEMENT ([place]);  Surgeon: Tonny Dae Antonucci, MD;  Location: [facility];  Service: Ophthalmology;  Laterality: Right;  CDE 6.87   CATARACT EXTRACTION W/PHACO Left 03/13/2014   Procedure: CATARACT EXTRACTION PHACO AND INTRAOCULAR LENS PLACEMENT ([facility]);  Surgeon: Tonny Chanan Detwiler, MD;  Location: [facility];  Service: Ophthalmology;  Laterality: Left;  CDE:  5.42   COLONOSCOPY W/ POLYPECTOMY  2009   FINGER TENDON REPAIR Right    pinky and ring finger     No Known Allergies    Family History  Problem Relation Age of Onset   Cancer Father      Social History Mr. Vondrak reports that he quit smoking about 27 years ago. He started smoking about 63 years ago. He has a 39.00 pack-year smoking history. He has never used smokeless tobacco. Mr. Plack reports that he does not currently use alcohol.   Review of Systems CONSTITUTIONAL: No weight loss, fever, chills, weakness or fatigue.  HEENT: Eyes: No visual loss, blurred vision, double vision or yellow  sclerae.No hearing loss, sneezing, congestion, runny nose or sore throat.  SKIN: No rash or itching.  CARDIOVASCULAR: per hpi RESPIRATORY: No shortness of breath, cough or sputum.  GASTROINTESTINAL: No anorexia, nausea, vomiting or diarrhea. No abdominal pain or blood.  GENITOURINARY: No burning on urination, no polyuria NEUROLOGICAL: No headache, dizziness, syncope, paralysis, ataxia, numbness or tingling in the extremities. No change in bowel or bladder control.  MUSCULOSKELETAL: No muscle, back pain, joint pain or stiffness.  LYMPHATICS: No enlarged nodes. No history of splenectomy.  PSYCHIATRIC: No history of depression or anxiety.  ENDOCRINOLOGIC: No reports of sweating, cold or heat intolerance. No polyuria or polydipsia.  Marland Kitchen   Physical Examination Today's Vitals   04/01/22 0758   BP: (!) 146/92  Pulse: 84  SpO2: 96%  Weight: 228 lb (103.4 kg)  Height: '5\' 11"'$  (1.803 m)   Body mass index is 31.8 kg/m.  Gen: resting comfortably, no acute distress HEENT: no scleral icterus, pupils equal round and reactive, no palptable cervical adenopathy,  CV: RRR, no m/r/g no jvd Resp: Clear to auscultation bilaterally GI: abdomen is soft, non-tender, non-distended, normal bowel sounds, no hepatosplenomegaly MSK: extremities are warm, no edema.  Skin: warm, no rash Neuro:  no focal deficits Psych: appropriate affect   Diagnostic Studies  03/2019 echo IMPRESSIONS     1. The left ventricle has normal systolic function, with an ejection  fraction of 55-60%. The cavity size was normal. There is mildly increased  left ventricular wall thickness. Left ventricular diastolic Doppler  parameters are consistent with impaired  relaxation. Indeterminate filling pressures.   2. The right ventricle has normal systolic function. The cavity was  normal. There is no increase in right ventricular wall thickness.   3. No evidence of mitral valve stenosis.   4. The aortic valve is tricuspid. Aortic valve regurgitation is mild by  color flow Doppler. No stenosis of the aortic valve.   5. The aortic root is normal in size and structure.   6. Pulmonary hypertension is indeterminant, inadequate TR jet.   7. The interatrial septum was not well visualized.      Assessment and Plan   1. CAD - no symptoms, continue current meds   2. HTN - typically higher in clinic and home bp's at goal - home bp'es remain 130s/80s overall at goal, continue currentmeds   3. Hyperlipidemia - compliant with meds, request labs from pcp   F/u 1 year  Arnoldo Lenis, M.D.

## 2022-04-01 NOTE — Patient Instructions (Signed)

## 2022-04-05 ENCOUNTER — Encounter: Payer: Self-pay | Admitting: *Deleted

## 2022-04-25 ENCOUNTER — Other Ambulatory Visit: Payer: Self-pay | Admitting: Cardiology

## 2022-07-28 ENCOUNTER — Ambulatory Visit: Payer: Medicare HMO | Admitting: Cardiology

## 2022-08-03 ENCOUNTER — Other Ambulatory Visit: Payer: Self-pay | Admitting: Cardiology

## 2022-10-29 ENCOUNTER — Other Ambulatory Visit: Payer: Self-pay | Admitting: Cardiology

## 2022-11-03 ENCOUNTER — Other Ambulatory Visit: Payer: Self-pay

## 2022-11-03 MED ORDER — ATORVASTATIN CALCIUM 20 MG PO TABS: 20.0000 mg | ORAL_TABLET | Freq: Every day | ORAL | 3 refills | Status: DC

## 2023-03-06 NOTE — Progress Notes (Signed)
Cardiology Office Note:  .   Date:  03/20/2023  ID:  Kobain, Lamirande [birth year], MRN [id number] PCP: Ponciano Ort [facility]  [facility] Providers Cardiologist:  Dina Rich, MD    History of Present Illness: .   Phillip Preston is a 77 y.o. male history of CAD inf MI with DES 1996, HTN, HLD.  Patient last saw Dr. Wyline Mood 03/2022  Patient comes in for f/u. PCP increased toprol and BP 130/70's at home but high here.He got off the phone before coming here and frustrated. Denies chest pain, dyspnea, palpitations, dizziness or edema. Watching salt closely. Doesn't eat out much. 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   ROS:    Studies Reviewed: Marland Kitchen   EKG Interpretation Date/Time:  Monday March 20 2023 11:05:21 EDT Ventricular Rate:  74 PR Interval:  202 QRS Duration:  116 QT Interval:  424 QTC Calculation: 470 R Axis:   66  Text Interpretation: Normal sinus rhythm Possible Lateral infarct , age undetermined Inferior infarct , age undetermined No change from previous EKG Confirmed by Jacolyn Reedy [phone]) on 03/20/2023 11:46:59 AM   EKG Interpretation Date/Time:  Monday March 20 2023 11:05:21 EDT Ventricular Rate:  74 PR Interval:  202 QRS Duration:  116 QT Interval:  424 QTC Calculation: 470 R Axis:   66  Text Interpretation: Normal sinus rhythm Possible Lateral infarct , age undetermined Inferior infarct , age undetermined No change from previous EKG Confirmed by Jacolyn Reedy [phone]) on 03/20/2023 11:46:59 AM    03/2019 echo IMPRESSIONS     1. The left ventricle has normal systolic function, with an ejection  fraction of 55-60%. The cavity size was normal. There is mildly increased  left ventricular wall thickness. Left ventricular diastolic Doppler  parameters are consistent with impaired  relaxation. Indeterminate filling pressures.   2. The right ventricle has normal systolic function. The cavity was  normal. There is no increase in right ventricular  wall thickness.   3. No evidence of mitral valve stenosis.   4. The aortic valve is tricuspid. Aortic valve regurgitation is mild by  color flow Doppler. No stenosis of the aortic valve.   5. The aortic root is normal in size and structure.   6. Pulmonary hypertension is indeterminant, inadequate TR jet.   7. The interatrial septum was not well visualized.    Risk Assessment/Calculations:     HYPERTENSION CONTROL Vitals:   03/20/23 1100 03/20/23 1127  BP: (!) 152/84 (!) 152/90    The patient's blood pressure is elevated above target today.  In order to address the patient's elevated BP: Blood pressure will be monitored at home to determine if medication changes need to be made.; A current anti-hypertensive medication was adjusted today.          Physical Exam:   VS:  BP (!) 152/90   Pulse 74   Ht 5\' 11"  (1.803 m)   Wt 224 lb (101.6 kg)   SpO2 95%   BMI 31.24 kg/m    Wt Readings from Last 3 Encounters:  03/20/23 224 lb (101.6 kg)  04/01/22 228 lb (103.4 kg)  01/12/21 230 lb (104.3 kg)    GEN: Well nourished, well developed in no acute distress NECK: No JVD; No carotid bruits CARDIAC:  RRR, no murmurs, rubs, gallops RESPIRATORY:  Clear to auscultation without rales, wheezing or rhonchi  ABDOMEN: Soft, non-tender, non-distended EXTREMITIES:  No edema; No deformity   ASSESSMENT AND PLAN: .  CAD inf MI 1996 DES RCA with residual 50-60% pLAD, 30% pLAD, 50-60% CFX-no angina, getting regular exercise. Continue atorvastatin, fish oil  HTN BP running high. Will increase chlorthalidone 50 mg daily. Due for blood work in a couple weeks. Will make sure bmet is checked.   HLD labs reviewed in [facility] 10/26/22 LDL 56 trig 173 and A1C 6.5 -has been watching sugars in his diet and due for repeat blood work with PCP       Dispo: f/u with Dr. Wyline Mood in 1 yr.  Signed, Jacolyn Reedy, PA-C

## 2023-03-20 ENCOUNTER — Encounter: Payer: Self-pay | Admitting: Physician Assistant

## 2023-03-20 ENCOUNTER — Ambulatory Visit: Payer: Medicare HMO | Attending: Physician Assistant | Admitting: Physician Assistant

## 2023-03-20 VITALS — BP 152/90 | HR 74 | Ht 71.0 in | Wt 224.0 lb

## 2023-03-20 DIAGNOSIS — I10 Essential (primary) hypertension: Secondary | ICD-10-CM | POA: Diagnosis not present

## 2023-03-20 DIAGNOSIS — E782 Mixed hyperlipidemia: Secondary | ICD-10-CM

## 2023-03-20 DIAGNOSIS — I2510 Atherosclerotic heart disease of native coronary artery without angina pectoris: Secondary | ICD-10-CM

## 2023-03-20 MED ORDER — CHLORTHALIDONE 50 MG PO TABS: 50.0000 mg | ORAL_TABLET | Freq: Every day | ORAL | 3 refills | Status: DC

## 2023-03-20 NOTE — Patient Instructions (Signed)
Medication Instructions:   INCREASE Chlorthalidone to 50 mg daily  Labwork:  BMET in 2 weeks (04/03/23)   Testing/Procedures: None today  Follow-Up: 1 year with Dr.Branch  Any Other Special Instructions Will Be Listed Below (If Applicable).  If you need a refill on your cardiac medications before your next appointment, please call your pharmacy.

## 2023-04-05 LAB — BASIC METABOLIC PANEL
BUN/Creatinine Ratio: 10 (ref 10–24)
BUN: 11 mg/dL (ref 8–27)
CO2: 27 mmol/L (ref 20–29)
Calcium: 10.1 mg/dL (ref 8.6–10.2)
Chloride: 94 mmol/L — ABNORMAL LOW (ref 96–106)
Creatinine, Ser: 1.11 mg/dL (ref 0.76–1.27)
Glucose: 137 mg/dL — ABNORMAL HIGH (ref 70–99)
Potassium: 4 mmol/L (ref 3.5–5.2)
Sodium: 139 mmol/L (ref 134–144)
eGFR: 68 mL/min/{1.73_m2} (ref >59)

## 2023-10-17 ENCOUNTER — Other Ambulatory Visit: Payer: Self-pay | Admitting: Cardiology

## 2024-03-06 ENCOUNTER — Other Ambulatory Visit: Payer: Self-pay | Admitting: Physician Assistant

## 2024-04-05 ENCOUNTER — Other Ambulatory Visit: Payer: Self-pay | Admitting: Physician Assistant

## 2024-04-11 ENCOUNTER — Other Ambulatory Visit: Payer: Self-pay

## 2024-04-11 ENCOUNTER — Ambulatory Visit: Attending: Cardiology | Admitting: Cardiology

## 2024-04-11 ENCOUNTER — Encounter (HOSPITAL_COMMUNITY): Payer: Self-pay

## 2024-04-11 ENCOUNTER — Emergency Department (HOSPITAL_COMMUNITY)
Admission: EM | Admit: 2024-04-11 | Discharge: 2024-04-11 | Disposition: A | Discharge status: Home / Self Care | Attending: Emergency Medicine | Admitting: Emergency Medicine

## 2024-04-11 VITALS — BP 112/75 | HR 85 | Ht 71.0 in | Wt 220.0 lb

## 2024-04-11 DIAGNOSIS — I2510 Atherosclerotic heart disease of native coronary artery without angina pectoris: Secondary | ICD-10-CM | POA: Diagnosis not present

## 2024-04-11 DIAGNOSIS — Z7982 Long term (current) use of aspirin: Secondary | ICD-10-CM | POA: Insufficient documentation

## 2024-04-11 DIAGNOSIS — I10 Essential (primary) hypertension: Secondary | ICD-10-CM | POA: Diagnosis not present

## 2024-04-11 DIAGNOSIS — E782 Mixed hyperlipidemia: Secondary | ICD-10-CM

## 2024-04-11 DIAGNOSIS — R339 Retention of urine, unspecified: Secondary | ICD-10-CM | POA: Insufficient documentation

## 2024-04-11 LAB — URINALYSIS, ROUTINE W REFLEX MICROSCOPIC
Bilirubin Urine: NEGATIVE
Glucose, UA: NEGATIVE mg/dL
Ketones, ur: NEGATIVE mg/dL
Nitrite: NEGATIVE
Protein, ur: NEGATIVE mg/dL
RBC / HPF: >50 RBC/hpf (ref 0–5)
Specific Gravity, Urine: 1.005 (ref 1.005–1.030)
pH: 6 (ref 5.0–8.0)

## 2024-04-11 NOTE — ED Provider Notes (Signed)
 [place] EMERGENCY DEPARTMENT AT [facility] Provider Note   CSN: [id number] Arrival date & time: 04/11/24  9488     Patient presents with: Urinary Retention   Phillip Preston is a 78 y.o. male.   Patient is a 78 year old male with history of enlarged prostate, hypertension, hyperlipidemia, peripheral vascular disease.  Patient presenting today with complaints of urinary retention.  He was able to urinate yesterday evening, however through the night has now been unable.  He feels as though he has to go, but is unable to establish a urine stream.  No fevers or chills.       Prior to Admission medications   Medication Sig Start Date End Date Taking? Authorizing Provider  aspirin 81 MG tablet Take 81 mg by mouth daily.    [provider]  atorvastatin  (LIPITOR) 20 MG tablet Take 1 tablet by mouth once daily 10/17/23   Alvan Dorn FALCON, MD  chlorthalidone  (HYGROTON ) 50 MG tablet Take 1 tablet (50 mg total) by mouth daily. 04/05/24   Alvan Dorn FALCON, MD  fish oil-omega-3 fatty acids 1000 MG capsule Take 1 capsule by mouth 2 (two) times daily.    [provider]  metoprolol  succinate (TOPROL -XL) 50 MG 24 hr tablet Take 75 mg by mouth daily. Take with or immediately following a meal.    [provider]  omeprazole  (PRILOSEC) 40 MG capsule Take 1 capsule by mouth once daily 10/17/23   Alvan Dorn FALCON, MD  ramipril  (ALTACE ) 10 MG capsule Take 1 capsule by mouth once daily 10/17/23   Alvan Dorn FALCON, MD  tamsulosin (FLOMAX) 0.4 MG CAPS capsule Take 0.4 mg by mouth.    [provider]    Allergies: Patient has no known allergies.    Review of Systems  All other systems reviewed and are negative.   Updated Vital Signs Pulse (!) 111   Temp 97.8 F (36.6 C)   Resp 20   Ht 5' 11 (1.803 m)   Wt 100.7 kg   SpO2 97%   BMI 30.96 kg/m   Physical Exam Vitals and nursing note reviewed.  Constitutional:      Appearance: Normal  appearance.  Pulmonary:     Effort: Pulmonary effort is normal.  Abdominal:     General: Abdomen is flat. There is no distension.     Tenderness: There is abdominal tenderness. There is no guarding or rebound.     Comments: There is mild tenderness in the suprapubic region.  Skin:    General: Skin is warm.  Neurological:     Mental Status: He is alert.     (all labs ordered are listed, but only abnormal results are displayed) Labs Reviewed  URINALYSIS, ROUTINE W REFLEX MICROSCOPIC    EKG: None  Radiology: No results found.   Procedures   Medications Ordered in the ED - No data to display                                  Medical Decision Making Amount and/or Complexity of Data Reviewed Labs: ordered.   Patient is a 78 year old male presenting with urinary retention.  He does have a history of BPH and is taking Flomax, but was unable to void this morning.  Foley catheter placed with approximately 800 cc of urine obtained and relief of symptoms.  Urinalysis clear.  Patient to be discharged with urology follow-up.  Final diagnoses:  None    ED Discharge Orders     None          Geroldine Berg, MD 04/11/24 0630

## 2024-04-11 NOTE — Progress Notes (Signed)
 Clinical Summary [name] is a 78 y.o.male seen today for follow up of the following medical problems.   1. CAD - prior inferior MI in 1996, received DES - no chest pain, no SOB/DOE - compliant with meds     2. HTN - bp's often high in clinic and normal at home.  - compliant with meds - home bp's typically 110s/70s   3. Hyperlipidemia - he is on atorvastain 20mg  daily.  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, country, blues   Past Medical History:  Diagnosis Date   Arteriosclerotic cardiovascular disease (ASCVD)    IMI requiring RCA stent; EF- 50%;repeat RCA stent in 6/96 with residual 50-60% mid LAD & Cx   Cancer [facility])    skin cancer   Cholelithiasis 2009   Asymptomatic   Colonic polyp 2009   Gastroesophageal reflux disease    HOH (hard of hearing)    Hyperlipidemia    Hypertension    Myocardial infarction [facility]) 1996   Peripheral vascular disease ([facility])    decreased distal pulses; asymptomatic   Tobacco abuse, 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     atorvastatin  (LIPITOR) 20 MG tablet Take 1 tablet by mouth once daily 90 tablet 3   chlorthalidone  (HYGROTON ) 50 MG tablet Take 1 tablet (50 mg total) by mouth daily. 30 tablet 0   fish oil-omega-3 fatty acids 1000 MG capsule Take 1 capsule by mouth 2 (two) times daily.     metoprolol  succinate (TOPROL -XL) 50 MG 24 hr tablet Take 75 mg by mouth daily. Take with or immediately following a meal.     omeprazole  (PRILOSEC) 40 MG capsule Take 1 capsule by mouth once daily 90 capsule 3   ramipril  (ALTACE ) 10 MG capsule Take 1 capsule by mouth once daily 90 capsule 3   tamsulosin (FLOMAX) 0.4 MG CAPS capsule  Take 0.4 mg by mouth.     No current facility-administered medications for this visit.     Past Surgical History:  Procedure Laterality Date   APPENDECTOMY     CARDIAC CATHETERIZATION     2 stents   CATARACT EXTRACTION W/PHACO Right 02/17/2014   Procedure: CATARACT EXTRACTION PHACO AND INTRAOCULAR LENS PLACEMENT ([facility]);  Surgeon: Cherene Mania, MD;  Location: [facility];  Service: Ophthalmology;  Laterality: Right;  CDE 6.87   CATARACT EXTRACTION W/PHACO Left 03/13/2014   Procedure: CATARACT EXTRACTION PHACO AND INTRAOCULAR LENS PLACEMENT ([facility]);  Surgeon: Cherene Mania, MD;  Location: [facility];  Service: Ophthalmology;  Laterality: Left;  CDE:  5.42   COLONOSCOPY W/ POLYPECTOMY  2009   FINGER TENDON REPAIR Right    pinky and ring finger     No Known Allergies    Family History  Problem Relation Age of Onset   Cancer Father      Social History [name] reports that he quit smoking about 29 years ago. His smoking use included cigarettes. He started smoking about 65 years ago. He has a 47.4 pack-year smoking history. He has never used smokeless tobacco. [name] reports current alcohol use.  Physical Examination Today's Vitals   04/11/24 1126  BP: 112/75  Pulse: 85  SpO2: 94%  Weight: 220 lb (99.8 kg)  Height: 5' 11 (1.803 m)   Body mass index is 30.68 kg/m.  Gen: resting comfortably, no acute distress HEENT: no scleral icterus, pupils equal round and reactive, no palptable cervical adenopathy,  CV: RRR, no mrg, no jvd Resp: Clear to auscultation bilaterally GI: abdomen is soft, non-tender, non-distended, normal bowel sounds, no hepatosplenomegaly MSK: extremities are warm, no edema.  Skin: warm, no rash Neuro:  no focal deficits Psych: appropriate affect   Diagnostic Studies  03/2019 echo IMPRESSIONS     1. The left ventricle has normal systolic function, with an ejection  fraction of 55-60%. The cavity size was normal. There is mildly increased  left  ventricular wall thickness. Left ventricular diastolic Doppler  parameters are consistent with impaired  relaxation. Indeterminate filling pressures.   2. The right ventricle has normal systolic function. The cavity was  normal. There is no increase in right ventricular wall thickness.   3. No evidence of mitral valve stenosis.   4. The aortic valve is tricuspid. Aortic valve regurgitation is mild by  color flow Doppler. No stenosis of the aortic valve.   5. The aortic root is normal in size and structure.   6. Pulmonary hypertension is indeterminant, inadequate TR jet.   7. The interatrial septum was not well visualized.    Assessment and Plan  1. CAD -no recent symptoms, continue current meds - EKG today SR, no ischemic changes   2. HTN - typically higher in clinic and home bp's at goal - he is at goal, continue current meds   3. Hyperlipidemia -LDL at goal, discussed dietary changes to help lower TGs   F/u 1 year    Dorn PHEBE Ross, M.D.

## 2024-04-11 NOTE — Patient Instructions (Signed)

## 2024-04-11 NOTE — Discharge Instructions (Signed)
 Follow-up with urology in the next few days.  The contact information for Dr. Sherrilee has been provided in this discharge summary for you to call and make these arrangements.  Return to the emergency department if you experience any new and/or concerning issues.

## 2024-04-11 NOTE — ED Triage Notes (Signed)
 POV from home cc of urinary retention. Last full void at 11pm. Woke up at 1 and had a small dribble. Has been on flomax for a year but hasn't had any issues like this until now.

## 2024-04-12 ENCOUNTER — Ambulatory Visit: Admitting: Cardiology

## 2024-04-23 ENCOUNTER — Encounter: Payer: Self-pay | Admitting: Family Medicine

## 2024-04-30 ENCOUNTER — Ambulatory Visit: Admitting: Urology

## 2024-05-01 ENCOUNTER — Other Ambulatory Visit: Payer: Self-pay | Admitting: Cardiology

## 2024-05-31 ENCOUNTER — Other Ambulatory Visit: Payer: Self-pay | Admitting: Urology

## 2024-06-03 ENCOUNTER — Telehealth: Payer: Self-pay | Admitting: Cardiology

## 2024-06-03 ENCOUNTER — Telehealth: Payer: Self-pay

## 2024-06-03 NOTE — Telephone Encounter (Signed)
 S/W pt and scheduled TELE preop appt 07/08/24. Med Rec and Consent done   Informed the patient that if procedure date is changed to have the surgeons office let our office know so that we can make sure the TELE appt is done in time for the procedure.  Will update the surgeons office.

## 2024-06-03 NOTE — Telephone Encounter (Signed)
 Med Rec and Consent done     Patient Consent for Virtual Visit        Phillip Preston has provided verbal consent on 06/03/2024 for a virtual visit (video or telephone).   CONSENT FOR VIRTUAL VISIT FOR:  Phillip Preston  By participating in this virtual visit I agree to the following:  I hereby voluntarily request, consent and authorize [facility] and its employed or contracted physicians, physician assistants, nurse practitioners or other licensed health care professionals (the Practitioner), to provide me with telemedicine health care services (the "Services) as deemed necessary by the treating Practitioner. I acknowledge and consent to receive the Services by the Practitioner via telemedicine. I understand that the telemedicine visit will involve communicating with the Practitioner through live audiovisual communication technology and the disclosure of certain medical information by electronic transmission. I acknowledge that I have been given the opportunity to request an in-person assessment or other available alternative prior to the telemedicine visit and am voluntarily participating in the telemedicine visit.  I understand that I have the right to withhold or withdraw my consent to the use of telemedicine in the course of my care at any time, without affecting my right to future care or treatment, and that the Practitioner or I may terminate the telemedicine visit at any time. I understand that I have the right to inspect all information obtained and/or recorded in the course of the telemedicine visit and may receive copies of available information for a reasonable fee.  I understand that some of the potential risks of receiving the Services via telemedicine include:  Delay or interruption in medical evaluation due to technological equipment failure or disruption; Information transmitted may not be sufficient (e.g. poor resolution of images) to allow for appropriate medical decision  making by the Practitioner; and/or  In rare instances, security protocols could fail, causing a breach of personal health information.  Furthermore, I acknowledge that it is my responsibility to provide information about my medical history, conditions and care that is complete and accurate to the best of my ability. I acknowledge that Practitioner's advice, recommendations, and/or decision may be based on factors not within their control, such as incomplete or inaccurate data provided by me or distortions of diagnostic images or specimens that may result from electronic transmissions. I understand that the practice of medicine is not an exact science and that Practitioner makes no warranties or guarantees regarding treatment outcomes. I acknowledge that a copy of this consent can be made available to me via my patient portal [facility] MyChart), or I can request a printed copy by calling the office of [facility].    I understand that my insurance will be billed for this visit.   I have read or had this consent read to me. I understand the contents of this consent, which adequately explains the benefits and risks of the Services being provided via telemedicine.  I have been provided ample opportunity to ask questions regarding this consent and the Services and have had my questions answered to my satisfaction. I give my informed consent for the services to be provided through the use of telemedicine in my medical care

## 2024-06-03 NOTE — Telephone Encounter (Signed)
   Pre-operative Risk Assessment    Patient Name: Phillip Preston  DOB: [birth year] MRN: [id number]      Request for Surgical Clearance    Procedure:  Surgery PROSTATECTOMY, SIMPLE, ROBOT-ASSISTED   Date of Surgery:  Clearance 07/31/24                                 Surgeon:  Dr Ricardo Likens  Surgeon's Group or Practice Name:  [facility]  Phone number:  [phone] Fax number:  [phone]   Type of Clearance Requested:   - Medical  - Pharmacy:  Hold Aspirin     Type of Anesthesia:  General    Additional requests/questions:  Na   SignedKerri A Stovall   06/03/2024, 10:48 AM

## 2024-06-03 NOTE — Telephone Encounter (Signed)
   Name: Phillip Preston  DOB: [birth year]  MRN: [id number]  Primary Cardiologist: Alvan Carrier, MD   Preoperative team, please contact this patient and set up a phone call appointment for further preoperative risk assessment. Procedure is not scheduled until 07/31/2024 so telehealth visit can be end of October/ early November. Please obtain consent and complete medication review. Thank you for your help!  I have confirmed the patient resides in the state of [place] . As per [facility] Medical Board telemedicine laws, the patient must reside in the state in which the provider is licensed.  In regards to Aspirin, he has a history of CAD with remote MI and stenting to RCA x2 in the 1990s. However, prostatectomies are considered to have a high bleeding risk. I cannot find details about his remote PCI but suspect that with 2 stents to the RCA, total length of stents is >25-58mm. Therefore, will reach out to primary Cardiologist in regards to recommendations for holding Aspirin (per office protocol) so this is available at upcoming telephone visit. Dr. Alvan, can you please comment on recommendations for holding Aspirin. Please route response back to P CV DIV PREOP. Thank you!   Analys Ryden E Michalle Rademaker, PA-C 06/03/2024, 11:52 AM [facility]

## 2024-07-08 ENCOUNTER — Ambulatory Visit: Attending: Cardiology | Admitting: Emergency Medicine

## 2024-07-08 DIAGNOSIS — Z01810 Encounter for preprocedural cardiovascular examination: Secondary | ICD-10-CM | POA: Diagnosis not present

## 2024-07-08 NOTE — Progress Notes (Signed)
 Virtual Visit via Telephone Note   Because of Devante W Pruden co-morbid illnesses, he is at least at moderate risk for complications without adequate follow up.  This format is felt to be most appropriate for this patient at this time.  Due to technical limitations with video connection (technology), today's appointment will be conducted as an audio only telehealth visit, and Luismiguel W Mccole verbally agreed to proceed in this manner.   All issues noted in this document were discussed and addressed.  No physical exam could be performed with this format.  Evaluation Performed:  Preoperative cardiovascular risk assessment _____________   Date:  07/08/2024   Patient ID:  Kaliq W Savidge, DOB August 29, 1946, MRN [id number] Patient Location:  Home Provider location:   Office  Primary Care Provider:  [facility], [facility] Primary Cardiologist:  Alvan Carrier, MD  Chief Complaint / Patient Profile   78 y.o. y/o male with a h/o coronary artery disease s/p inferior MI in 1996, hypertension, hyperlipidemia who is pending prostatectomy, simple, robot-assisted on 07/31/2024 with [facility] and presents today for telephonic preoperative cardiovascular risk assessment.  History of Present Illness    Morley Gaumer Delpriore is a 78 y.o. male who presents via audio/video conferencing for a telehealth visit today.  Pt was last seen in cardiology clinic on 04/11/2024 by Dr. Alvan.  At that time BARTLETT ENKE was doing well.  The patient is now pending procedure as outlined above. Since his last visit, he denies chest pain, shortness of breath, lower extremity edema, fatigue, palpitations, melena, hemoptysis, diaphoresis, weakness, presyncope, syncope, orthopnea, and PND.  Today patient is doing well without acute cardiovascular concerns or complaints.  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  He also dances several times throughout the week with no chest pains or  dyspnea.  Overall no symptoms to suggest active angina.  He is easily able to complete greater than 4 METS.  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; discontinued in 1996   Trigger finger    Right fifth finger   Past Surgical History:  Procedure Laterality Date   APPENDECTOMY     CARDIAC CATHETERIZATION     2 stents   CATARACT EXTRACTION W/PHACO Right 02/17/2014   Procedure: CATARACT EXTRACTION PHACO AND INTRAOCULAR LENS PLACEMENT (IOC);  Surgeon: Cherene Mania, MD;  Location: [facility];  Service: Ophthalmology;  Laterality: Right;  CDE 6.87   CATARACT EXTRACTION W/PHACO Left 03/13/2014   Procedure: CATARACT EXTRACTION PHACO AND INTRAOCULAR LENS PLACEMENT (IOC);  Surgeon: Cherene Mania, MD;  Location: [facility];  Service: Ophthalmology;  Laterality: Left;  CDE:  5.42   COLONOSCOPY W/ POLYPECTOMY  2009   FINGER TENDON REPAIR Right    pinky and ring finger    Allergies  No Known Allergies  Home Medications    Prior to Admission medications   Medication Sig Start  Date End Date Taking? Authorizing Provider  aspirin 81 MG tablet Take 81 mg by mouth daily.    [provider]  atorvastatin  (LIPITOR) 20 MG tablet Take 1 tablet by mouth once daily 10/17/23   Alvan Dorn FALCON, MD  chlorthalidone  (HYGROTON ) 50 MG tablet Take 1 tablet by mouth once daily 05/01/24   Branch, Jonathan F, MD  fish oil-omega-3 fatty acids 1000 MG capsule Take 1 capsule by mouth 2 (two) times daily.    [provider]  metoprolol  succinate (TOPROL -XL) 50 MG 24 hr tablet Take 75 mg by mouth daily. Take with or immediately following a meal.    [provider]  omeprazole  (PRILOSEC) 40 MG capsule Take 1 capsule by mouth once daily 10/17/23   Alvan Dorn FALCON, MD  ramipril  (ALTACE ) 10 MG capsule Take 1 capsule by mouth once daily 10/17/23   Alvan Dorn FALCON, MD  tamsulosin (FLOMAX) 0.4 MG CAPS capsule Take 0.4 mg by mouth.    [provider]    Physical Exam    Vital Signs:  Carmon W Kos does not have vital signs available for review today.  Given telephonic nature of communication, physical exam is limited. AAOx3. NAD. Normal affect.  Speech and respirations are unlabored.  Accessory Clinical Findings    None  Assessment & Plan    1.  Preoperative Cardiovascular Risk Assessment: According to the Revised Cardiac Risk Index (RCRI), his Perioperative Risk of Major Cardiac Event is (%): 0.9. His Functional Capacity in METs is: 7.34 according to the Duke Activity Status Index (DASI).  Therefore, based on ACC/AHA guidelines, patient would be at acceptable risk for the planned procedure without further cardiovascular testing. I will route this recommendation to the requesting party via Epic fax function.  The patient was advised that if he develops new symptoms prior to surgery to contact our office to arrange for a follow-up visit, and he verbalized understanding.  Per Dr. Alvan: Manuelita to hold aspirin as needed   He may hold aspirin for 7 days prior to procedure. Please resume aspirin as soon as possible postprocedure, at the discretion of the surgeon.    A copy of this note will be routed to requesting surgeon.  Time:   Today, I have spent 8 minutes with the patient with telehealth technology discussing medical history, symptoms, and management plan.     Lum LITTIE Louis, NP  07/08/2024, 9:47 AM

## 2024-07-16 NOTE — Progress Notes (Addendum)
 Anesthesia Review:  PCP: DR Luke at the [facility] in [place]  Cardiologist :  Ethel Ross LOV 04/11/24  Madison Fountain,NP treop Telephone visit on 07/08/24.  PPM/ ICD: Device Orders: Rep Notified:  Chest x-ray : EKG : 04/11/24  Echo : 2020  Stress test: Cardiac Cath :   Activity level: can do a flight of stairs wtihout difficulty  Sleep Study/ CPAP : none  Fasting Blood Sugar :      / Checks Blood Sugar -- times a day:    Blood Thinner/ Instructions /Last Dose: ASA / Instructions/ Last Dose :    HOH- Hearing aids

## 2024-07-17 NOTE — Patient Instructions (Addendum)
 SURGICAL WAITING ROOM VISITATION  Patients having surgery or a procedure may have no more than 2 support people in the waiting area - these visitors may rotate.    Children under the age of 13 must have an adult with them who is not the patient.  Visitors with respiratory illnesses are discouraged from visiting and should remain at home.  If the patient needs to stay at the hospital during part of their recovery, the visitor guidelines for inpatient rooms apply. Pre-op nurse will coordinate an appropriate time for 1 support person to accompany patient in pre-op.  This support person may not rotate.    Please refer to the [facility] website for the visitor guidelines for Inpatients (after your surgery is over and you are in a regular room).       Your procedure is scheduled on:  07/31/2024    Report to [facility] Main Entrance    Report to admitting at  1000 AM   Call this number if you have problems the morning of surgery [phone]   Clear liquid diet the day before surgery.            Magnesium Citrate 8 ounces at 12 noon day before surgery.            Fleets enema nite before surgery    After Midnight you may have the following liquids until _ 0900_____ AM DAY OF SURGERY  Water Non-Citrus Juices (without pulp, NO RED-Apple, White grape, White cranberry) Black Coffee (NO MILK/CREAM OR CREAMERS, sugar ok)  Clear Tea (NO MILK/CREAM OR CREAMERS, sugar ok) regular and decaf                             Plain Jell-O (NO RED)                                           Fruit ices (not with fruit pulp, NO RED)                                     Popsicles (NO RED)                                                               Sports drinks like Gatorade (NO RED)                           If you have questions, please contact your surgeon's office.   FOLLOW BOWEL PREP AND ANY ADDITIONAL PRE OP INSTRUCTIONS YOU RECEIVED FROM YOUR SURGEON'S OFFICE!!!     Oral Hygiene  is also important to reduce your risk of infection.                                    Remember - BRUSH YOUR TEETH THE MORNING OF SURGERY WITH YOUR REGULAR TOOTHPASTE  DENTURES WILL BE REMOVED PRIOR TO SURGERY PLEASE DO NOT APPLY Poly grip OR ADHESIVES!!!  Do NOT smoke after Midnight   Stop all vitamins and herbal supplements 7 days before surgery.   Take these medicines the morning of surgery with A SIP OF WATER:  toprol , omeprazole , flomax   DO NOT TAKE ANY ORAL DIABETIC MEDICATIONS DAY OF YOUR SURGERY  Bring CPAP mask and tubing day of surgery.                              You may not have any metal on your body including hair pins, jewelry, and body piercing             Do not wear make-up, lotions, powders, perfumes/cologne, or deodorant  Do not wear nail polish including gel and S&S, artificial/acrylic nails, or any other type of covering on natural nails including finger and toenails. If you have artificial nails, gel coating, etc. that needs to be removed by a nail salon please have this removed prior to surgery or surgery may need to be canceled/ delayed if the surgeon/ anesthesia feels like they are unable to be safely monitored.   Do not shave  48 hours prior to surgery.               Men may shave face and neck.   Do not bring valuables to the hospital. [facility] IS NOT             RESPONSIBLE   FOR VALUABLES.   Contacts, glasses, dentures or bridgework may not be worn into surgery.   Bring small overnight bag day of surgery.   DO NOT BRING YOUR HOME MEDICATIONS TO THE HOSPITAL. PHARMACY WILL DISPENSE MEDICATIONS LISTED ON YOUR MEDICATION LIST TO YOU DURING YOUR ADMISSION IN THE HOSPITAL!    Patients discharged on the day of surgery will not be allowed to drive home.  Someone NEEDS to stay with you for the first 24 hours after anesthesia.   Special Instructions: Bring a copy of your healthcare power of attorney and living will documents the day of surgery if you  haven't scanned them before.              Please read over the following fact sheets you were given: IF YOU HAVE QUESTIONS ABOUT YOUR PRE-OP INSTRUCTIONS PLEASE CALL [phone].   If you received a COVID test during your pre-op visit  it is requested that you wear a mask when out in public, stay away from anyone that may not be feeling well and notify your surgeon if you develop symptoms. If you test positive for Covid or have been in contact with anyone that has tested positive in the last 10 days please notify you surgeon.    [facility] - Preparing for Surgery Before surgery, you can play an important role.  Because skin is not sterile, your skin needs to be as free of germs as possible.  You can reduce the number of germs on your skin by washing with CHG (chlorahexidine gluconate) soap before surgery.  CHG is an antiseptic cleaner which kills germs and bonds with the skin to continue killing germs even after washing. Please DO NOT use if you have an allergy to CHG or antibacterial soaps.  If your skin becomes reddened/irritated stop using the CHG and inform your nurse when you arrive at Short Stay. Do not shave (including legs and underarms) for at least 48 hours prior to the first CHG shower.  You may shave your face/neck.  Please follow these instructions carefully:  1.  Shower with CHG Soap the night before surgery ONLY (DO NOT USE THE SOAP THE MORNING OF SURGERY).  2.  If you choose to wash your hair, wash your hair first as usual with your normal  shampoo.  3.  After you shampoo, rinse your hair and body thoroughly to remove the shampoo.                             4.  Use CHG as you would any other liquid soap.  You can apply chg directly to the skin and wash.  Gently with a scrungie or clean washcloth.  5.  Apply the CHG Soap to your body ONLY FROM THE NECK DOWN.   Do   not use on face/ open                           Wound or open sores. Avoid contact with eyes, ears mouth and   genitals  (private parts).                       Wash face,  Genitals (private parts) with your normal soap.             6.  Wash thoroughly, paying special attention to the area where your    surgery  will be performed.  7.  Thoroughly rinse your body with warm water from the neck down.  8.  DO NOT shower/wash with your normal soap after using and rinsing off the CHG Soap.                9.  Pat yourself dry with a clean towel.            10.  Wear clean pajamas.            11.  Place clean sheets on your bed the night of your first shower and do not  sleep with pets. Day of Surgery : Do not apply any CHG, lotions/deodorants the morning of surgery.  Please wear clean clothes to the hospital/surgery center.  FAILURE TO FOLLOW THESE INSTRUCTIONS MAY RESULT IN THE CANCELLATION OF YOUR SURGERY  PATIENT SIGNATURE_________________________________  NURSE SIGNATURE__________________________________  ________________________________________________________________________

## 2024-07-22 ENCOUNTER — Encounter (HOSPITAL_COMMUNITY)
Admission: RE | Admit: 2024-07-22 | Discharge: 2024-07-22 | Disposition: A | Source: Ambulatory Visit | Attending: Urology

## 2024-07-22 ENCOUNTER — Encounter (HOSPITAL_COMMUNITY): Payer: Self-pay

## 2024-07-22 ENCOUNTER — Other Ambulatory Visit: Payer: Self-pay

## 2024-07-22 VITALS — BP 154/88 | HR 72 | Temp 98.3°F | Resp 16 | Ht 70.0 in | Wt 213.0 lb

## 2024-07-22 DIAGNOSIS — I493 Ventricular premature depolarization: Secondary | ICD-10-CM | POA: Diagnosis not present

## 2024-07-22 DIAGNOSIS — Z01818 Encounter for other preprocedural examination: Secondary | ICD-10-CM | POA: Diagnosis present

## 2024-07-22 DIAGNOSIS — R338 Other retention of urine: Secondary | ICD-10-CM | POA: Diagnosis not present

## 2024-07-22 DIAGNOSIS — Z955 Presence of coronary angioplasty implant and graft: Secondary | ICD-10-CM | POA: Diagnosis not present

## 2024-07-22 DIAGNOSIS — Z01812 Encounter for preprocedural laboratory examination: Secondary | ICD-10-CM | POA: Insufficient documentation

## 2024-07-22 DIAGNOSIS — I10 Essential (primary) hypertension: Secondary | ICD-10-CM | POA: Diagnosis not present

## 2024-07-22 DIAGNOSIS — N401 Enlarged prostate with lower urinary tract symptoms: Secondary | ICD-10-CM | POA: Diagnosis not present

## 2024-07-22 DIAGNOSIS — I2510 Atherosclerotic heart disease of native coronary artery without angina pectoris: Secondary | ICD-10-CM | POA: Insufficient documentation

## 2024-07-22 HISTORY — DX: Cardiac murmur, unspecified: R01.1

## 2024-07-22 HISTORY — DX: Anxiety disorder, unspecified: F41.9

## 2024-07-22 LAB — CBC
HCT: 44.5 % (ref 39.0–52.0)
Hemoglobin: 15.1 g/dL (ref 13.0–17.0)
MCH: 28.7 pg (ref 26.0–34.0)
MCHC: 33.9 g/dL (ref 30.0–36.0)
MCV: 84.6 fL (ref 80.0–100.0)
Platelets: 279 K/uL (ref 150–400)
RBC: 5.26 MIL/uL (ref 4.22–5.81)
RDW: 13.4 % (ref 11.5–15.5)
WBC: 9.3 K/uL (ref 4.0–10.5)
nRBC: 0 % (ref 0.0–0.2)

## 2024-07-22 LAB — BASIC METABOLIC PANEL WITH GFR
Anion gap: 12 (ref 5–15)
BUN: 11 mg/dL (ref 8–23)
CO2: 31 mmol/L (ref 22–32)
Calcium: 9.8 mg/dL (ref 8.9–10.3)
Chloride: 93 mmol/L — ABNORMAL LOW (ref 98–111)
Creatinine, Ser: 1.15 mg/dL (ref 0.61–1.24)
GFR, Estimated: >60 mL/min (ref >60)
Glucose, Bld: 137 mg/dL — ABNORMAL HIGH (ref 70–99)
Potassium: 3.9 mmol/L (ref 3.5–5.1)
Sodium: 136 mmol/L (ref 135–145)

## 2024-07-23 NOTE — Progress Notes (Signed)
 Anesthesia Chart Review   Case: [id number] Date/Time: 07/31/24 1145   Procedure: PROSTATECTOMY, SIMPLE, ROBOT-ASSISTED   Anesthesia type: General   Diagnosis:      Enlarged prostate with urinary retention [N40.1, R33.8]     Hypertrophy of prostate with urinary retention [N40.1, R33.8]   Pre-op diagnosis: MASSIVE PROSTATE AND RETENTION   [facility] ROOM 03 / [facility]   Surgeons: Alvaro Ricardo KATHEE Mickey., MD       DISCUSSION:78 y.o. former smoker with h/o HTN, CAD s/p DES 1996, PVC, enlarged prostate with retention scheduled for above procedure 07/31/2024 with Dr. Ricardo Alvaro.   Per cardiology preoperative evaluation 07/08/2024, According to the Revised Cardiac Risk Index (RCRI), his Perioperative Risk of Major Cardiac Event is (%): 0.9. His Functional Capacity in METs is: 7.34 according to the Duke Activity Status Index (DASI).   Therefore, based on ACC/AHA guidelines, patient would be at acceptable risk for the planned procedure without further cardiovascular testing. I will route this recommendation to the requesting party via Epic fax function.   The patient was advised that if he develops new symptoms prior to surgery to contact our office to arrange for a follow-up visit, and he verbalized understanding.   Per Dr. Alvan: Manuelita to hold aspirin as needed    He may hold aspirin for 7 days prior to procedure. Please resume aspirin as soon as possible postprocedure, at the discretion of the surgeon.   VS: BP (!) 154/88   Pulse 72   Temp 36.8 C (Oral)   Resp 16   Ht 5' 10 (1.778 m)   Wt 96.6 kg   SpO2 100%   BMI 30.56 kg/m   PROVIDERS: [facility], [facility], MD is Cardiologist  LABS: Labs reviewed: Acceptable for surgery. (all labs ordered are listed, but only abnormal results are displayed)  Labs Reviewed  BASIC METABOLIC PANEL WITH GFR - Abnormal; Notable for the following components:      Result Value   Chloride 93 (*)    Glucose, Bld 137 (*)     All other components within normal limits  CBC  TYPE AND SCREEN     IMAGES:   EKG:   CV: Echo 04/02/2019 1. The left ventricle has normal systolic function, with an ejection  fraction of 55-60%. The cavity size was normal. There is mildly increased  left ventricular wall thickness. Left ventricular diastolic Doppler  parameters are consistent with impaired  relaxation. Indeterminate filling pressures.   2. The right ventricle has normal systolic function. The cavity was  normal. There is no increase in right ventricular wall thickness.   3. No evidence of mitral valve stenosis.   4. The aortic valve is tricuspid. Aortic valve regurgitation is mild by  color flow Doppler. No stenosis of the aortic valve.   5. The aortic root is normal in size and structure.   6. Pulmonary hypertension is indeterminant, inadequate TR jet.   7. The interatrial septum was not well visualized.   Past Medical History:  Diagnosis Date   Anxiety    Arteriosclerotic cardiovascular disease (ASCVD)    IMI requiring RCA stent; EF- 50%;repeat RCA stent in 6/96 with residual 50-60% mid LAD & Cx   Cancer [facility])    skin cancer, prostate cancer   Cholelithiasis 09/13/2007   Asymptomatic   Colonic polyp 09/13/2007   Gastroesophageal reflux disease    Heart murmur    age 80 had a murmur- rheumatic fever   HOH (hard of  hearing)    Hyperlipidemia    Hypertension    Myocardial infarction (HCC) 09/12/1994   Peripheral vascular disease    decreased distal pulses; asymptomatic   Tobacco abuse, 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; discontinued in 1996   Trigger finger    Right fifth finger    Past Surgical History:  Procedure Laterality Date   APPENDECTOMY     CARDIAC CATHETERIZATION     2 stents   CATARACT EXTRACTION W/PHACO Right 02/17/2014   Procedure: CATARACT EXTRACTION PHACO AND INTRAOCULAR LENS PLACEMENT (IOC);  Surgeon: Cherene Mania, MD;  Location: [facility];  Service: Ophthalmology;  Laterality: Right;   CDE 6.87   CATARACT EXTRACTION W/PHACO Left 03/13/2014   Procedure: CATARACT EXTRACTION PHACO AND INTRAOCULAR LENS PLACEMENT (IOC);  Surgeon: Cherene Mania, MD;  Location: [facility];  Service: Ophthalmology;  Laterality: Left;  CDE:  5.42   COLONOSCOPY W/ POLYPECTOMY  2009   FINGER TENDON REPAIR Right    pinky and ring finger    MEDICATIONS:  aspirin EC 81 MG tablet   atorvastatin  (LIPITOR) 20 MG tablet   carboxymethylcellulose (REFRESH PLUS) 0.5 % SOLN   chlorthalidone  (HYGROTON ) 50 MG tablet   fish oil-omega-3 fatty acids 1000 MG capsule   hydrOXYzine (ATARAX) 25 MG tablet   metoprolol  succinate (TOPROL -XL) 50 MG 24 hr tablet   omeprazole  (PRILOSEC) 40 MG capsule   ramipril  (ALTACE ) 10 MG capsule   No current facility-administered medications for this encounter.    Harlene Hoots Ward, PA-C [facility] Pre-Surgical Testing [phone]

## 2024-07-31 ENCOUNTER — Observation Stay (HOSPITAL_COMMUNITY)
Admission: RE | Admit: 2024-07-31 | Discharge: 2024-08-01 | Disposition: A | Discharge status: Home / Self Care | Source: Ambulatory Visit | Attending: Urology | Admitting: Urology

## 2024-07-31 ENCOUNTER — Ambulatory Visit (HOSPITAL_COMMUNITY): Admitting: Registered Nurse

## 2024-07-31 ENCOUNTER — Encounter (HOSPITAL_COMMUNITY): Payer: Self-pay | Admitting: Urology

## 2024-07-31 ENCOUNTER — Ambulatory Visit (HOSPITAL_COMMUNITY): Payer: Self-pay | Admitting: Physician Assistant

## 2024-07-31 ENCOUNTER — Other Ambulatory Visit: Payer: Self-pay

## 2024-07-31 ENCOUNTER — Encounter (HOSPITAL_COMMUNITY): Admission: RE | Disposition: A | Payer: Self-pay | Source: Ambulatory Visit | Attending: Urology

## 2024-07-31 DIAGNOSIS — N138 Other obstructive and reflux uropathy: Principal | ICD-10-CM | POA: Diagnosis present

## 2024-07-31 DIAGNOSIS — Z79899 Other long term (current) drug therapy: Secondary | ICD-10-CM | POA: Insufficient documentation

## 2024-07-31 DIAGNOSIS — I10 Essential (primary) hypertension: Secondary | ICD-10-CM | POA: Diagnosis not present

## 2024-07-31 DIAGNOSIS — Z87891 Personal history of nicotine dependence: Secondary | ICD-10-CM | POA: Diagnosis not present

## 2024-07-31 DIAGNOSIS — Z85828 Personal history of other malignant neoplasm of skin: Secondary | ICD-10-CM | POA: Diagnosis not present

## 2024-07-31 DIAGNOSIS — R338 Other retention of urine: Secondary | ICD-10-CM | POA: Diagnosis present

## 2024-07-31 DIAGNOSIS — I252 Old myocardial infarction: Secondary | ICD-10-CM | POA: Insufficient documentation

## 2024-07-31 DIAGNOSIS — Z7982 Long term (current) use of aspirin: Secondary | ICD-10-CM | POA: Diagnosis not present

## 2024-07-31 DIAGNOSIS — N401 Enlarged prostate with lower urinary tract symptoms: Principal | ICD-10-CM | POA: Insufficient documentation

## 2024-07-31 DIAGNOSIS — Z01818 Encounter for other preprocedural examination: Secondary | ICD-10-CM

## 2024-07-31 HISTORY — PX: XI ROBOTIC ASSISTED SIMPLE PROSTATECTOMY: SHX6713

## 2024-07-31 LAB — TYPE AND SCREEN
ABO/RH(D): A POS
Antibody Screen: NEGATIVE

## 2024-07-31 LAB — HEMOGLOBIN AND HEMATOCRIT, BLOOD
HCT: 41.4 % (ref 39.0–52.0)
Hemoglobin: 14 g/dL (ref 13.0–17.0)

## 2024-07-31 LAB — ABO/RH: ABO/RH(D): A POS

## 2024-07-31 SURGERY — PROSTATECTOMY, SIMPLE, ROBOT-ASSISTED
Anesthesia: General | Site: Abdomen

## 2024-07-31 MED ORDER — SODIUM CHLORIDE 0.9 % IV BOLUS
1000.0000 mL | Freq: Once | INTRAVENOUS | Status: AC
  Administered 2024-07-31: 1000 mL via INTRAVENOUS

## 2024-07-31 MED ORDER — PROPOFOL 10 MG/ML IV BOLUS
INTRAVENOUS | Status: DC | PRN
  Administered 2024-07-31: 50 ug/kg/min via INTRAVENOUS
  Administered 2024-07-31: 200 mg via INTRAVENOUS

## 2024-07-31 MED ORDER — LACTATED RINGERS IV SOLN: INTRAVENOUS | Status: DC

## 2024-07-31 MED ORDER — DOCUSATE SODIUM 100 MG PO CAPS
100.0000 mg | ORAL_CAPSULE | Freq: Two times a day (BID) | ORAL | Status: DC
  Administered 2024-07-31 – 2024-08-01 (×2): 100 mg via ORAL
  Filled 2024-07-31 (×2): qty 1

## 2024-07-31 MED ORDER — SODIUM CHLORIDE (PF) 0.9 % IJ SOLN
INTRAMUSCULAR | Status: AC
  Filled 2024-07-31: qty 20

## 2024-07-31 MED ORDER — ACETAMINOPHEN 10 MG/ML IV SOLN
INTRAVENOUS | Status: DC | PRN
  Administered 2024-07-31: 1000 mg via INTRAVENOUS

## 2024-07-31 MED ORDER — SODIUM CHLORIDE 0.9 % IV SOLN: INTRAVENOUS | Status: DC

## 2024-07-31 MED ORDER — SUGAMMADEX SODIUM 200 MG/2ML IV SOLN
INTRAVENOUS | Status: DC | PRN
  Administered 2024-07-31: 200 mg via INTRAVENOUS

## 2024-07-31 MED ORDER — HYDROMORPHONE HCL 1 MG/ML IJ SOLN: 0.2500 mg | INTRAMUSCULAR | Status: DC | PRN

## 2024-07-31 MED ORDER — ONDANSETRON HCL 4 MG/2ML IJ SOLN: 4.0000 mg | INTRAMUSCULAR | Status: DC | PRN

## 2024-07-31 MED ORDER — MIDAZOLAM HCL (PF) 2 MG/2ML IJ SOLN: 0.5000 mg | Freq: Once | INTRAMUSCULAR | Status: DC | PRN

## 2024-07-31 MED ORDER — CEFAZOLIN SODIUM-DEXTROSE 2-4 GM/100ML-% IV SOLN
2.0000 g | INTRAVENOUS | Status: AC
  Administered 2024-07-31: 2 g via INTRAVENOUS
  Filled 2024-07-31: qty 100

## 2024-07-31 MED ORDER — MAGNESIUM CITRATE PO SOLN: 1.0000 | Freq: Once | ORAL | Status: DC

## 2024-07-31 MED ORDER — BUPIVACAINE LIPOSOME 1.3 % IJ SUSP
INTRAMUSCULAR | Status: DC | PRN
  Administered 2024-07-31: 20 mL

## 2024-07-31 MED ORDER — CHLORTHALIDONE 25 MG PO TABS
50.0000 mg | ORAL_TABLET | Freq: Every day | ORAL | Status: DC
  Administered 2024-08-01: 50 mg via ORAL
  Filled 2024-07-31: qty 2

## 2024-07-31 MED ORDER — CHLORHEXIDINE GLUCONATE 0.12 % MT SOLN
15.0000 mL | Freq: Once | OROMUCOSAL | Status: AC
  Administered 2024-07-31: 15 mL via OROMUCOSAL

## 2024-07-31 MED ORDER — HYDROMORPHONE HCL 1 MG/ML IJ SOLN
INTRAMUSCULAR | Status: DC | PRN
  Administered 2024-07-31: 1 mg via INTRAVENOUS

## 2024-07-31 MED ORDER — RAMIPRIL 5 MG PO CAPS
10.0000 mg | ORAL_CAPSULE | Freq: Every day | ORAL | Status: DC
  Administered 2024-08-01: 10 mg via ORAL
  Filled 2024-07-31: qty 2

## 2024-07-31 MED ORDER — SUGAMMADEX SODIUM 200 MG/2ML IV SOLN
INTRAVENOUS | Status: AC
  Filled 2024-07-31: qty 2

## 2024-07-31 MED ORDER — FENTANYL CITRATE (PF) 100 MCG/2ML IJ SOLN
INTRAMUSCULAR | Status: DC | PRN
  Administered 2024-07-31 (×2): 50 ug via INTRAVENOUS
  Administered 2024-07-31: 100 ug via INTRAVENOUS

## 2024-07-31 MED ORDER — DIPHENHYDRAMINE HCL 50 MG/ML IJ SOLN: 12.5000 mg | Freq: Four times a day (QID) | INTRAMUSCULAR | Status: DC | PRN

## 2024-07-31 MED ORDER — OXYCODONE HCL 5 MG PO TABS: 5.0000 mg | ORAL_TABLET | Freq: Once | ORAL | Status: DC | PRN

## 2024-07-31 MED ORDER — DOCUSATE SODIUM 100 MG PO CAPS: 100.0000 mg | ORAL_CAPSULE | Freq: Two times a day (BID) | ORAL | Status: AC

## 2024-07-31 MED ORDER — SULFAMETHOXAZOLE-TRIMETHOPRIM 800-160 MG PO TABS: 1.0000 | ORAL_TABLET | Freq: Two times a day (BID) | ORAL | 0 refills | Status: AC

## 2024-07-31 MED ORDER — ROCURONIUM BROMIDE 10 MG/ML (PF) SYRINGE
PREFILLED_SYRINGE | INTRAVENOUS | Status: AC
Start: 2024-07-31 — End: 2024-07-31
  Filled 2024-07-31: qty 10

## 2024-07-31 MED ORDER — PANTOPRAZOLE SODIUM 40 MG PO TBEC
80.0000 mg | DELAYED_RELEASE_TABLET | Freq: Every day | ORAL | Status: DC
  Administered 2024-07-31: 80 mg via ORAL
  Filled 2024-07-31 (×2): qty 2

## 2024-07-31 MED ORDER — BUPIVACAINE LIPOSOME 1.3 % IJ SUSP
INTRAMUSCULAR | Status: AC
  Filled 2024-07-31: qty 20

## 2024-07-31 MED ORDER — TRIPLE ANTIBIOTIC 3.5-400-5000 EX OINT: 1.0000 | TOPICAL_OINTMENT | Freq: Three times a day (TID) | CUTANEOUS | Status: DC | PRN

## 2024-07-31 MED ORDER — ONDANSETRON HCL 4 MG/2ML IJ SOLN
INTRAMUSCULAR | Status: DC | PRN
  Administered 2024-07-31: 4 mg via INTRAVENOUS

## 2024-07-31 MED ORDER — MEPERIDINE HCL 25 MG/ML IJ SOLN: 6.2500 mg | INTRAMUSCULAR | Status: DC | PRN

## 2024-07-31 MED ORDER — LACTATED RINGERS IR SOLN
Status: DC | PRN
  Administered 2024-07-31: 1000 mL

## 2024-07-31 MED ORDER — PROPOFOL 1000 MG/100ML IV EMUL
INTRAVENOUS | Status: AC
Start: 2024-07-31 — End: 2024-07-31
  Filled 2024-07-31: qty 100

## 2024-07-31 MED ORDER — HYOSCYAMINE SULFATE 0.125 MG SL SUBL: 0.1250 mg | SUBLINGUAL_TABLET | SUBLINGUAL | Status: DC | PRN

## 2024-07-31 MED ORDER — METOPROLOL SUCCINATE ER 50 MG PO TB24
75.0000 mg | ORAL_TABLET | Freq: Every day | ORAL | Status: DC
  Administered 2024-07-31: 75 mg via ORAL
  Filled 2024-07-31 (×2): qty 1

## 2024-07-31 MED ORDER — HYDROMORPHONE HCL 2 MG/ML IJ SOLN
INTRAMUSCULAR | Status: AC
  Filled 2024-07-31: qty 1

## 2024-07-31 MED ORDER — ACETAMINOPHEN 500 MG PO TABS: 1000.0000 mg | ORAL_TABLET | Freq: Once | ORAL | Status: DC

## 2024-07-31 MED ORDER — HYDROMORPHONE HCL 1 MG/ML IJ SOLN: 0.5000 mg | INTRAMUSCULAR | Status: DC | PRN

## 2024-07-31 MED ORDER — HYDROCODONE-ACETAMINOPHEN 5-325 MG PO TABS: 1.0000 | ORAL_TABLET | Freq: Four times a day (QID) | ORAL | 0 refills | Status: AC | PRN

## 2024-07-31 MED ORDER — ORAL CARE MOUTH RINSE: 15.0000 mL | Freq: Once | OROMUCOSAL | Status: AC

## 2024-07-31 MED ORDER — ACETAMINOPHEN 500 MG PO TABS
1000.0000 mg | ORAL_TABLET | Freq: Four times a day (QID) | ORAL | Status: DC
  Administered 2024-07-31 – 2024-08-01 (×3): 1000 mg via ORAL
  Filled 2024-07-31 (×3): qty 2

## 2024-07-31 MED ORDER — OXYCODONE HCL 5 MG PO TABS: 5.0000 mg | ORAL_TABLET | ORAL | Status: DC | PRN

## 2024-07-31 MED ORDER — ATORVASTATIN CALCIUM 20 MG PO TABS
20.0000 mg | ORAL_TABLET | Freq: Every day | ORAL | Status: DC
  Filled 2024-07-31: qty 1

## 2024-07-31 MED ORDER — PHENYLEPHRINE HCL-NACL 20-0.9 MG/250ML-% IV SOLN
INTRAVENOUS | Status: DC | PRN
  Administered 2024-07-31: 20 ug/min via INTRAVENOUS

## 2024-07-31 MED ORDER — DEXMEDETOMIDINE HCL IN NACL 80 MCG/20ML IV SOLN
INTRAVENOUS | Status: DC | PRN
  Administered 2024-07-31: 12 ug via INTRAVENOUS

## 2024-07-31 MED ORDER — DIPHENHYDRAMINE HCL 12.5 MG/5ML PO ELIX: 12.5000 mg | ORAL_SOLUTION | Freq: Four times a day (QID) | ORAL | Status: DC | PRN

## 2024-07-31 MED ORDER — SODIUM CHLORIDE (PF) 0.9 % IJ SOLN
INTRAMUSCULAR | Status: DC | PRN
  Administered 2024-07-31: 20 mL

## 2024-07-31 MED ORDER — DEXAMETHASONE SODIUM PHOSPHATE 4 MG/ML IJ SOLN
INTRAMUSCULAR | Status: DC | PRN
  Administered 2024-07-31: 4 mg via INTRAVENOUS

## 2024-07-31 MED ORDER — FENTANYL CITRATE (PF) 100 MCG/2ML IJ SOLN
INTRAMUSCULAR | Status: AC
  Filled 2024-07-31: qty 2

## 2024-07-31 MED ORDER — LIDOCAINE HCL 2 % IJ SOLN
INTRAMUSCULAR | Status: AC
Start: 2024-07-31 — End: 2024-07-31
  Filled 2024-07-31: qty 20

## 2024-07-31 MED ORDER — ONDANSETRON HCL 4 MG/2ML IJ SOLN
INTRAMUSCULAR | Status: AC
Start: 2024-07-31 — End: 2024-07-31
  Filled 2024-07-31: qty 2

## 2024-07-31 MED ORDER — OXYCODONE HCL 5 MG/5ML PO SOLN: 5.0000 mg | Freq: Once | ORAL | Status: DC | PRN

## 2024-07-31 MED ORDER — FLEET ENEMA RE ENEM: 1.0000 | ENEMA | Freq: Once | RECTAL | Status: DC

## 2024-07-31 MED ORDER — PROPOFOL 10 MG/ML IV BOLUS
INTRAVENOUS | Status: AC
  Filled 2024-07-31: qty 20

## 2024-07-31 MED ORDER — LIDOCAINE HCL (CARDIAC) PF 100 MG/5ML IV SOSY
PREFILLED_SYRINGE | INTRAVENOUS | Status: DC | PRN
  Administered 2024-07-31: 40 mg via INTRAVENOUS

## 2024-07-31 MED ORDER — ROCURONIUM BROMIDE 10 MG/ML (PF) SYRINGE
PREFILLED_SYRINGE | INTRAVENOUS | Status: DC | PRN
  Administered 2024-07-31: 60 mg via INTRAVENOUS
  Administered 2024-07-31 (×2): 20 mg via INTRAVENOUS

## 2024-07-31 MED ORDER — ACETAMINOPHEN 10 MG/ML IV SOLN
INTRAVENOUS | Status: AC
  Filled 2024-07-31: qty 100

## 2024-07-31 MED ORDER — STERILE WATER FOR IRRIGATION IR SOLN
Status: DC | PRN
  Administered 2024-07-31: 1000 mL

## 2024-07-31 SURGICAL SUPPLY — 54 items
APPLICATOR COTTON TIP 6 STRL (MISCELLANEOUS) ×2 IMPLANT
BAG COUNTER SPONGE SURGICOUNT (BAG) IMPLANT
CATH FOLEY 2WAY SLVR 18FR 30CC (CATHETERS) ×2 IMPLANT
CATH TIEMANN FOLEY 18FR 5CC (CATHETERS) IMPLANT
CATH URTH STD 24FR FL 3W 2 (CATHETERS) ×2 IMPLANT
CHLORAPREP W/TINT 26 (MISCELLANEOUS) ×2 IMPLANT
COVER SURGICAL LIGHT HANDLE (MISCELLANEOUS) ×2 IMPLANT
COVER TIP SHEARS 8 DVNC (MISCELLANEOUS) ×2 IMPLANT
CUTTER ECHEON FLEX ENDO 45 340 (ENDOMECHANICALS) IMPLANT
DERMABOND ADVANCED .7 DNX12 (GAUZE/BANDAGES/DRESSINGS) ×2 IMPLANT
DRAPE ARM DVNC X/XI (DISPOSABLE) ×8 IMPLANT
DRAPE COLUMN DVNC XI (DISPOSABLE) ×2 IMPLANT
DRAPE SURG IRRIG POUCH 19X23 (DRAPES) ×2 IMPLANT
DRIVER NDL LRG 8 DVNC XI (INSTRUMENTS) ×4 IMPLANT
DRIVER NDLE LRG 8 DVNC XI (INSTRUMENTS) ×2 IMPLANT
DRSG TEGADERM 4X4.75 (GAUZE/BANDAGES/DRESSINGS) ×2 IMPLANT
ELECT PENCIL ROCKER SW 15FT (MISCELLANEOUS) ×2 IMPLANT
ELECT REM PT RETURN 15FT ADLT (MISCELLANEOUS) ×2 IMPLANT
FORCEPS BPLR LNG DVNC XI (INSTRUMENTS) ×2 IMPLANT
FORCEPS PROGRASP DVNC XI (FORCEP) ×2 IMPLANT
GAUZE SPONGE 4X4 12PLY STRL (GAUZE/BANDAGES/DRESSINGS) ×2 IMPLANT
GLOVE BIO SURGEON STRL SZ 6.5 (GLOVE) ×2 IMPLANT
GLOVE BIOGEL PI IND STRL 7.5 (GLOVE) IMPLANT
GLOVE SURG LX STRL 7.5 STRW (GLOVE) ×4 IMPLANT
GOWN STRL REUS W/ TWL XL LVL3 (GOWN DISPOSABLE) ×4 IMPLANT
GOWN STRL SURGICAL XL XLNG (GOWN DISPOSABLE) ×2 IMPLANT
HOLDER FOLEY CATH W/STRAP (MISCELLANEOUS) ×2 IMPLANT
IRRIGATION SUCT STRKRFLW 2 WTP (MISCELLANEOUS) ×2 IMPLANT
IV LACTATED RINGERS 1000ML (IV SOLUTION) ×2 IMPLANT
KIT TURNOVER KIT A (KITS) ×2 IMPLANT
NDL INSUFFLATION 14GA 120MM (NEEDLE) ×2 IMPLANT
NEEDLE INSUFFLATION 14GA 120MM (NEEDLE) ×1 IMPLANT
PACK ROBOT UROLOGY CUSTOM (CUSTOM PROCEDURE TRAY) ×2 IMPLANT
PAD POSITIONING PINK XL (MISCELLANEOUS) ×2 IMPLANT
PORT ACCESS TROCAR AIRSEAL 12 (TROCAR) ×2 IMPLANT
RELOAD STAPLE 45 4.1 GRN THCK (STAPLE) ×2 IMPLANT
SCISSORS LAP 5X45 EPIX DISP (ENDOMECHANICALS) IMPLANT
SCISSORS MNPLR CVD DVNC XI (INSTRUMENTS) ×2 IMPLANT
SEAL UNIV 5-12 XI (MISCELLANEOUS) ×8 IMPLANT
SET TRI-LUMEN FLTR TB AIRSEAL (TUBING) ×2 IMPLANT
SOLUTION ELECTROSURG ANTI STCK (MISCELLANEOUS) ×2 IMPLANT
SPIKE FLUID TRANSFER (MISCELLANEOUS) ×2 IMPLANT
SPONGE T-LAP 4X18 ~~LOC~~+RFID (SPONGE) IMPLANT
SUT ETHILON 3 0 PS 1 (SUTURE) ×2 IMPLANT
SUT MNCRL AB 4-0 PS2 18 (SUTURE) ×4 IMPLANT
SUT PDS AB 1 CT1 27 (SUTURE) ×4 IMPLANT
SUT SILK 3 0 SH 30 (SUTURE) IMPLANT
SUT VIC AB 0 CT1 27XBRD ANTBC (SUTURE) ×6 IMPLANT
SUT VIC AB 2-0 SH 27X BRD (SUTURE) ×2 IMPLANT
SUT VICRYL 0 UR6 27IN ABS (SUTURE) ×2 IMPLANT
SUT VLOC 180 2-0 9IN GS21 (SUTURE) ×4 IMPLANT
SUTURE VLOC BRB 180 ABS3/0GR12 (SUTURE) ×4 IMPLANT
SYSTEM BAG RETRIEVAL 10MM (BASKET) ×2 IMPLANT
WATER STERILE IRR 1000ML POUR (IV SOLUTION) ×2 IMPLANT

## 2024-07-31 NOTE — Op Note (Unsigned)
 NAME: Phillip Preston, JURGENS MEDICAL RECORD NO: [id number] ACCOUNT NO: [id number] DATE OF BIRTH: 1945/10/08 FACILITY: [facility] LOCATION: [id number] PHYSICIAN: Ricardo Likens, MD  Operative Report   DATE OF PROCEDURE: 07/31/2024  SURGEON: Ricardo Likens, MD  PREOPERATIVE DIAGNOSIS: Massive prostate with refractory urinary changes.  PROCEDURE PERFORMED: Robotic-assisted laparoscopic simple prostatectomy.  ESTIMATED BLOOD LOSS: 300 mL.  COMPLICATIONS: None.  SPECIMENS: Prostate adenoma for pathology.  FINDINGS: 1. Very large bilobar prostatic hypertrophy. 2. Wide open fossa from the bladder neck to the membranous urethra following simple prostatectomy. 3. Moderate adhesions, right lower quadrant.  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 He has failed multiple trials of voiding with maximum medical therapy. Urodynamics corroborated preserved  bladder sensory and motor function. There is severe obstruction given the size of his prostate at nearly 200 g and his desire to become catheter free expeditiously. He desires simple prostatectomy. He presents for this today. Informed consent was  obtained and placed in the medical record.  DESCRIPTION OF PROCEDURE:  The patient being verified, procedure being simple prostatectomy was confirmed.  A procedure timeout was performed. Intravenous antibiotics administered, general endotracheal anesthesia induced.  The patient was placed into a  low lithotomy position.  A sterile field was created, prepping and draping the patient's penis, perineum, and proximal thigh using iodine  after this *** had been removed and his *** were carefully prepped and his infraxiphoid abdomen using chlorhexidine  gluconate, after clipper shaving and after tucking his arms to his side, padding with gel rolls and further fastening to the operating table using 3-inch tape over foam padding  across the supraxiphoid chest.  A LAVH type drape was placed and a new Foley  catheter was placed per urethra to straight drain and high flow, low pressure pneumoperitoneum was obtained using Veress technique in the supraumbilical midline, having passed the aspiration and drop test.  An 8 mm robotic camera port was then placed in this location. The laparoscope was introduced to examine the peritoneal abdomen with no visible visceral injury. There were some adhesions noted in the right lower quadrant. No active inflammation was noted.    Additional ports were placed as follows: Right paramedian 8 mm robotic port, right far lateral 12 mm AirSeal assistant port, right paramedian 5 mm suction port, left paramedian 8 mm robotic port, and left far lateral 8 mm robotic port.  The robot was  docked and passed the electronic checks.  Attention was directed at adhesiolysis. Dense adhesions in the right lower quadrant from the distal ileum and ileocecal area and cecum were carefully taken down off the superolateral surface of the bladder and  inguinal ring area. This does likely represent prior appendicitis, but no evidence of active inflammation was noted now. A very small serosal disruption was noted in the distal ileum. This was easily reapproximated using a U-stitch of silk. Again, no  luminal disruption whatsoever.  Having freed up the adhesions in the pelvis, attention was directed at the development of the space of Retzius.  An incision was made lateral to the right medial umbilical ligament from the midline towards the area of the internal ring, coursing along  the iliac vessels towards the area of the right ureter.  The right vas deferens was encountered, ligated, and used as a medial bucket handle and the right bladder wall, which was swept away from the pelvic sidewall towards the area of the endopelvic  fascia  on the right side.  A mirror-image dissection was performed on the left side.  The anterior  attachment was taken down with cautery scissors.  This exposed the anterior base of the prostate, which was defatted to better denote the bladder neck and  prostate junction.  Next, the endopelvic fascia was swept away from the apical aspect of the prostate just enough to allow the green load stapler to control the dorsal venous complex, taking exquisite care to avoid membranous injury, which did not occur.  The bladder neck was then identified by moving the Foley catheter back and forth, and an inverted U cystotomy was made approximately 2 cm proximal to the true bladder neck for about 50% circumference of the bladder. This provided excellent visualization  of the very large bilobar prostatic hypertrophy with significant intravesical protrusion. The ureteral orifices were easily visible, and the trigonal ridge was unremarkable. No stones were seen within the bladder.  Next, 4 figure-of-eight adenoma anchor sutures were placed, two posteriorly, two anteriorly for adenoma manipulation. Placing anterior traction on the posterior sutures, the posterior adenoma plane was entered approximately 2 cm distal to the trigonal  ridge, keeping a bladder mucosal flap proximally. The adenoma plane was entered at approximately the 6 o'clock position and then carried down towards the area of the apex of the prostate as noted by anterior curvature. This was then swept laterally  towards the 9 o'clock position and then from the 9 o'clock to 12 o'clock position respectively, freeing the adenoma from the prostatic capsule. Next, the adenoma was freed up on the left side from the 12 o'clock to 9 o'clock position and 9 o'clock to 12  o'clock positions respectively, freeing up the *** adenoma. Next, a butterfly-type incision was made in the adenoma at the 12 o'clock position along the Foley catheter so that the true apex could be further visualized. The apex was carefully released and  it completely freed up the very large  adenoma and the specimen was placed into an EndoCatch bag for later retrieval. The prostatic fossa was very carefully inspected. Additional hemostasis was achieved with point coagulation with current energy, which  resulted in very good hemostasis of the prostatic fossa, and there was no capsule disruption whatsoever. This was quite favorable.  Attention was directed to the mucosal advancement. A double-armed 3-0 Vicryl suture was used to reapproximate the posterior 50% circumference of the urethra, advancing this to the posterior circumference of the prior bladder mucosa, bringing the  structures in tension-free apposition, creating an excellent mucosal bridge from essentially the 4 o'clock to the 8 o'clock positions respectively.  The cystotomy was then reapproximated using two separate running suture layers of 2-0 V-Loc meeting at the 12 o'clock position and sewed together. A new 24-French 3-way Foley catheter was placed ***, and the 30 mL of water  in the balloon. The irrigation  port was connected to a straight drain.  A closed suction drain was brought through the previous left lateral most robotic port site into the peritoneal cavity. The robot was then undocked.  The previous right lateral most assistant port site was closed  at the level of the fascia using a Carter-Thomason suture passer with a 0 Vicryl.  The specimen was retrieved by extending the previous camera port site, superiorly and inferiorly for a  total distance of approximately 5 cm.  The new large adenoma  specimen was removed and set aside for permanent pathology.   The extraction site was closed to the level of  the fascia using figure-of-eight PDS x 4.  Followed by reapproximation of the Scarpa's with running Vicryl.  All incision sites were then  infiltrated with dilute lipolyzed Marcaine and closed at the level of the skin using subcuticular Monocryl followed by Dermabond.  The procedure was then terminated.  The patient tolerated  the procedure well.  No immediate periprocedural complications.   The patient was taken to the postanesthesia care unit in stable condition.   Plan for observation and admission.    Please note that the first assistant, Alan Hammonds was crucial for all portions of the surgery today.  She provided valuable retraction, suctioning, vascular stapling, specimen manipulation, robotic instrument exchange, and general first assistance.   MUK D: 07/31/2024 2:14:25 pm T: 07/31/2024 9:24:00 pm  JOB: [id number]/ [id number]

## 2024-07-31 NOTE — H&P (Signed)
 Phillip Preston is an 78 y.o. male.    Chief Complaint: Pre-Op Robotic Simple Prostatectomy  HPI:   1 - Massive Prostate With Urinary Retention - on tamsulsoin at baseline. Dempsey retention 04/2024 and catheter placed, failed trial of void. UDS obsrtructed picture PDet 40 no flow, TRUS 222gm no median. PSA <4 at baseline. Cr 1.28.   PMH sig for CAD/MI/Stent (follows Dr. Alvan cards), appy. 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 Lived independantly with wife. His PCP is with [facility] in [place].   Today  Phillip Preston is seen to proceed with robotic simple prostatecotmy. No interval fevers. Hgb 15, Cr 1.1, most recent UCX negative. No interval fevers.   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; discontinued in 1996   Trigger finger    Right fifth finger    Past Surgical History:  Procedure Laterality Date   APPENDECTOMY     CARDIAC CATHETERIZATION     2 stents   CATARACT EXTRACTION W/PHACO Right 02/17/2014   Procedure: CATARACT EXTRACTION PHACO AND INTRAOCULAR LENS PLACEMENT (IOC);  Surgeon: Cherene Mania, MD;  Location: [facility];  Service: Ophthalmology;  Laterality: Right;  CDE 6.87   CATARACT EXTRACTION W/PHACO Left 03/13/2014   Procedure: CATARACT EXTRACTION PHACO AND INTRAOCULAR LENS PLACEMENT (IOC);  Surgeon: Cherene Mania, MD;  Location: [facility];  Service: Ophthalmology;  Laterality: Left;  CDE:  5.42    COLONOSCOPY W/ POLYPECTOMY  2009   FINGER TENDON REPAIR Right    pinky and ring finger    Family History  Problem Relation Age of Onset   Cancer Father    Social History:  reports that he quit smoking about 29 years ago. His smoking use included cigarettes. He started smoking about 65 years ago. He has a 47.4 pack-year smoking history. He has never used smokeless tobacco. He reports that he does not drink alcohol and does not use drugs.  Allergies: No Known Allergies  No medications prior to admission.    No results found for this or any previous visit (from the past 48 hours). No results found.  Review of Systems  Constitutional:  Negative for chills and fever.  Genitourinary:  Positive for difficulty urinating.  All other systems reviewed and are negative.   There were no vitals taken for this visit. Physical Exam Vitals reviewed.  HENT:     Head: Normocephalic.     Nose: Nose normal.  Cardiovascular:     Rate and Rhythm: Normal rate.  Pulmonary:     Effort: Pulmonary effort is normal.  Genitourinary:    Comments: Foley in place with non-foul urine Musculoskeletal:        General: Normal range of motion.     Cervical back: Normal range of motion.  Skin:    General: Skin is warm.  Neurological:  General: No focal deficit present.     Mental Status: He is alert.      Assessment/Plan  Proceed as planned with robotic simple prostaetctomy. Risks, benefits, alternatives, expected peri-op course discussed previously and reiterated today.   Ricardo KATHEE Alvaro Mickey., MD 07/31/2024, 6:33 AM

## 2024-07-31 NOTE — Discharge Instructions (Addendum)
 1- Drain Sites - You may have some mild persistent drainage from old drain site for several days, this is normal. This can be covered with cotton gauze for convenience.  2 - Stiches - Your stitches are all dissolvable. You may notice a "loose thread" at your incisions, these are normal and require no intervention. You may cut them flush to the skin with fingernail clippers if needed for comfort.  3 - Diet - No restrictions  4 - Activity - No heavy lifting / straining (any activities that require valsalva or "bearing down") x 4 weeks. Otherwise, no restrictions.  5 - Bathing - You may shower immediately. Do not take a bath or get into swimming pool where incision sites are submersed in water x 4 weeks.   6 - Catheter - Will remain in place until removed at your next appointment. It may be cleaned with soap and water in the shower. It may be disconnected from the drain bag while in the shower to avoid tripping over the tube. You may apply Neosporin or Vaseline ointment as needed to the tip of the penis where the catheter inserts to reduce friction and irritation in this spot.   7 - When to Call the Doctor - Call MD for any fever >102, any acute wound problems, or any severe nausea / vomiting. You can call the [facility] [phone]) 24 hours a day 365 days a year. It will roll-over to the answering service and on-call physician after hours.   You may resume aspirin, advil, aleve, vitamins, and supplements 7 days after surgery.

## 2024-07-31 NOTE — Transfer of Care (Signed)
 Immediate Anesthesia Transfer of Care Note  Patient: Phillip Preston  Procedure(s) Performed: PROSTATECTOMY, SIMPLE, ROBOT-ASSISTED (Abdomen)  Patient Location: PACU  Anesthesia Type:General  Level of Consciousness: drowsy and confused  Airway & Oxygen Therapy: Patient Spontanous Breathing and Patient connected to face mask oxygen  Post-op Assessment: Report given to RN and Post -op Vital signs reviewed and stable  Post vital signs: Reviewed and stable  Last Vitals:  Vitals Value Taken Time  BP 123/97 07/31/24 14:45  Temp    Pulse 78 07/31/24 14:48  Resp 11 07/31/24 14:48  SpO2 100 % 07/31/24 14:48  Vitals shown include unfiled device data.  Last Pain:  Vitals:   07/31/24 1026  TempSrc:   PainSc: 0-No pain         Complications: No notable events documented.

## 2024-07-31 NOTE — Anesthesia Postprocedure Evaluation (Signed)
 Anesthesia Post Note  Patient: Phillip Preston  Procedure(s) Performed: PROSTATECTOMY, SIMPLE, ROBOT-ASSISTED (Abdomen)     Patient location during evaluation: PACU Anesthesia Type: General Level of consciousness: awake and alert, oriented and patient cooperative Pain management: pain level controlled Vital Signs Assessment: post-procedure vital signs reviewed and stable Respiratory status: spontaneous breathing, nonlabored ventilation and respiratory function stable Cardiovascular status: blood pressure returned to baseline and stable Postop Assessment: no apparent nausea or vomiting Anesthetic complications: no   No notable events documented.  Last Vitals:  Vitals:   07/31/24 1600 07/31/24 1615  BP: 120/74 131/70  Pulse: 76 80  Resp: 11 11  Temp:    SpO2: 100% 99%    Last Pain:  Vitals:   07/31/24 1615  TempSrc:   PainSc: 0-No pain                 Dejanira Pamintuan,E. Dariah Mcsorley

## 2024-07-31 NOTE — Anesthesia Preprocedure Evaluation (Addendum)
 Anesthesia Evaluation  Patient identified by MRN, date of birth, ID band Patient awake    Reviewed: Allergy & Precautions, NPO status , Patient's Chart, lab work & pertinent test results  History of Anesthesia Complications Negative for: history of anesthetic complications  Airway Mallampati: II  TM Distance: >3 FB Neck ROM: Full    Dental  (+) Edentulous Upper, Edentulous Lower   Pulmonary former smoker   breath sounds clear to auscultation       Cardiovascular hypertension, Pt. on medications (-) angina + Past MI, + Cardiac Stents and + Peripheral Vascular Disease   Rhythm:Regular Rate:Normal  '20 ECHO: EF 55-60%.  1. The cavity size was normal. There is mildly increased left ventricular wall thickness. Left ventricular diastolic Doppler parameters are consistent with impaired  relaxation. Indeterminate filling pressures.   2. The right ventricle has normal systolic function. The cavity was normal. There is no increase in right ventricular wall thickness.   3. No evidence of mitral valve stenosis.   4. The aortic valve is tricuspid. Aortic valve regurgitation is mild by color flow Doppler. No stenosis of the aortic valve.   5. The aortic root is normal in size and structure.     Neuro/Psych   Anxiety     negative neurological ROS     GI/Hepatic Neg liver ROS,GERD  Medicated and Controlled,,  Endo/Other  BMI 31  Renal/GU negative Renal ROS     Musculoskeletal   Abdominal   Peds  Hematology Hb 15.1, plt 279k   Anesthesia Other Findings   Reproductive/Obstetrics                              Anesthesia Physical Anesthesia Plan  ASA: 3  Anesthesia Plan: General   Post-op Pain Management: Tylenol PO (pre-op)*   Induction: Intravenous  PONV Risk Score and Plan: 2 and Ondansetron  and Dexamethasone   Airway Management Planned: Oral ETT  Additional Equipment: None  Intra-op Plan:    Post-operative Plan: Extubation in OR  Informed Consent: I have reviewed the patients History and Physical, chart, labs and discussed the procedure including the risks, benefits and alternatives for the proposed anesthesia with the patient or authorized representative who has indicated his/her understanding and acceptance.     Dental advisory given  Plan Discussed with: CRNA and Surgeon  Anesthesia Plan Comments:          Anesthesia Quick Evaluation

## 2024-07-31 NOTE — Anesthesia Procedure Notes (Signed)
 Procedure Name: Intubation Date/Time: 07/31/2024 11:12 AM  Performed by: Lanning Cena RAMAN, CRNAPre-anesthesia Checklist: Patient identified, Suction available, Emergency Drugs available, Patient being monitored and Timeout performed Patient Re-evaluated:Patient Re-evaluated prior to induction Oxygen Delivery Method: Circle system utilized Preoxygenation: Pre-oxygenation with 100% oxygen Induction Type: IV induction Ventilation: Two handed mask ventilation required and Oral airway inserted - appropriate to patient size Laryngoscope Size: Cleotilde and 3 Grade View: Grade I Tube type: Oral Tube size: 7.5 mm Number of attempts: 1 Airway Equipment and Method: Stylet Placement Confirmation: ETT inserted through vocal cords under direct vision, positive ETCO2, CO2 detector and breath sounds checked- equal and bilateral Secured at: 22 cm Tube secured with: Tape Dental Injury: Teeth and Oropharynx as per pre-operative assessment

## 2024-07-31 NOTE — Brief Op Note (Signed)
 07/31/2024  2:05 PM  PATIENT:  Phillip Preston  79 y.o. male  PRE-OPERATIVE DIAGNOSIS:  MASSIVE PROSTATE AND RETENTION  POST-OPERATIVE DIAGNOSIS:  MASSIVE PROSTATE AND RETENTION  PROCEDURE:  Procedure(s): PROSTATECTOMY, SIMPLE, ROBOT-ASSISTED (N/A)  SURGEON:  Surgeons and Role:    * Manny, Ricardo KATHEE Raddle., MD - Primary  PHYSICIAN ASSISTANT:   ASSISTANTS: Alan Hammonds PA   ANESTHESIA:   local and general  EBL:  300 mL   BLOOD ADMINISTERED:none  DRAINS: JP to bulb; 3 way foley to gravity (irrigation port plugged)   LOCAL MEDICATIONS USED:  MARCAINE     SPECIMEN:  Source of Specimen:  prostate adenoma  DISPOSITION OF SPECIMEN:  PATHOLOGY  COUNTS:  YES  TOURNIQUET:  * No tourniquets in log *  DICTATION: .Other Dictation: Dictation Number [id number]  PLAN OF CARE: Admit for overnight observation  PATIENT DISPOSITION:  PACU - hemodynamically stable.   Delay start of Pharmacological VTE agent (>24hrs) due to surgical blood loss or risk of bleeding: not applicable

## 2024-08-01 ENCOUNTER — Encounter (HOSPITAL_COMMUNITY): Payer: Self-pay | Admitting: Urology

## 2024-08-01 DIAGNOSIS — N401 Enlarged prostate with lower urinary tract symptoms: Secondary | ICD-10-CM | POA: Diagnosis not present

## 2024-08-01 LAB — BASIC METABOLIC PANEL WITH GFR
Anion gap: 12 (ref 5–15)
BUN: 12 mg/dL (ref 8–23)
CO2: 26 mmol/L (ref 22–32)
Calcium: 9 mg/dL (ref 8.9–10.3)
Chloride: 97 mmol/L — ABNORMAL LOW (ref 98–111)
Creatinine, Ser: 1.34 mg/dL — ABNORMAL HIGH (ref 0.61–1.24)
GFR, Estimated: 54 mL/min — ABNORMAL LOW (ref >60)
Glucose, Bld: 136 mg/dL — ABNORMAL HIGH (ref 70–99)
Potassium: 4.2 mmol/L (ref 3.5–5.1)
Sodium: 135 mmol/L (ref 135–145)

## 2024-08-01 LAB — HEMOGLOBIN AND HEMATOCRIT, BLOOD
HCT: 36.6 % — ABNORMAL LOW (ref 39.0–52.0)
Hemoglobin: 12.5 g/dL — ABNORMAL LOW (ref 13.0–17.0)

## 2024-08-01 MED ORDER — CHLORHEXIDINE GLUCONATE CLOTH 2 % EX PADS
6.0000 | MEDICATED_PAD | Freq: Every day | CUTANEOUS | Status: DC
  Administered 2024-08-01: 6 via TOPICAL

## 2024-08-01 MED ORDER — ORAL CARE MOUTH RINSE: 15.0000 mL | OROMUCOSAL | Status: DC | PRN

## 2024-08-01 NOTE — Discharge Summary (Signed)
 Date of admission: 07/31/2024  Date of discharge: 08/01/2024  Admission diagnosis: BPH with urinary retention  Discharge diagnosis: same  Secondary diagnoses:  Patient Active Problem List   Diagnosis Date Noted   BPH with obstruction/lower urinary tract symptoms 07/31/2024   Peripheral vascular disease    Arteriosclerotic cardiovascular disease (ASCVD)    Hypertension    Hyperlipidemia    Tobacco abuse, in remission     Procedures performed: Procedure(s): PROSTATECTOMY, SIMPLE, ROBOT-ASSISTED  History and Physical: For full details, please see admission history and physical. Briefly, Phillip Preston is a 78 y.o. year old patient with a history of BPH and urinary retention with chronic indwelling foley catheter who was scheduled for RASP with Dr. Alvaro 07/31/24.   Hospital Course: Patient tolerated the procedure well.  He was then transferred to the floor after an uneventful PACU stay.  His hospital course was uncomplicated.  On POD#1 he had met discharge criteria: was eating a regular diet, was up and ambulating independently,  pain was well controlled, and was ready to for discharge.  He was discharged with foley catheter in place with plan for trial of void on Dec. 1.   Laboratory values:  Recent Labs    07/31/24 1653 08/01/24 0445  HGB 14.0 12.5*  HCT 41.4 36.6*   Recent Labs    08/01/24 0445  NA 135  K 4.2  CL 97*  CO2 26  GLUCOSE 136*  BUN 12  CREATININE 1.34*  CALCIUM  9.0   No results for input(s): LABPT, INR in the last 72 hours. No results for input(s): LABURIN in the last 72 hours. No results found for this or any previous visit.  Disposition: Home  Discharge instruction:   1- Drain Sites - You may have some mild persistent drainage from old drain site for several days, this is normal. This can be covered with cotton gauze for convenience.  2 - Stiches - Your stitches are all dissolvable. You may notice a loose thread at your incisions, these  are normal and require no intervention. You may cut them flush to the skin with fingernail clippers if needed for comfort.  3 - Diet - No restrictions  4 - Activity - No heavy lifting / straining (any activities that require valsalva or bearing down) x 4 weeks. Otherwise, no restrictions.  5 - Bathing - You may shower immediately. Do not take a bath or get into swimming pool where incision sites are submersed in water  x 4 weeks.   6 - Catheter - Will remain in place until removed at your next appointment. It may be cleaned with soap and water  in the shower. It may be disconnected from the drain bag while in the shower to avoid tripping over the tube. You may apply Neosporin or Vaseline ointment as needed to the tip of the penis where the catheter inserts to reduce friction and irritation in this spot.   7 - When to Call the Doctor - Call MD for any fever >102, any acute wound problems, or any severe nausea / vomiting. You can call the [facility] [phone] 24 hours a day 365 days a year. It will roll-over to the answering service and on-call physician after hours.   You may resume aspirin, advil, aleve, vitamins, and supplements 7 days after surgery.  Discharge medications:  Allergies as of 08/01/2024   No Known Allergies      Medication List     STOP taking these medications    aspirin  EC 81 MG tablet   fish oil-omega-3 fatty acids 1000 MG capsule       TAKE these medications    atorvastatin  20 MG tablet Commonly known as: LIPITOR Take 1 tablet by mouth once daily What changed: when to take this   carboxymethylcellulose 0.5 % Soln Commonly known as: REFRESH PLUS Place 1 drop into both eyes in the morning.   chlorthalidone  50 MG tablet Commonly known as: HYGROTON  Take 1 tablet by mouth once daily   docusate sodium 100 MG capsule Commonly known as: COLACE Take 1 capsule (100 mg total) by mouth 2 (two) times daily.   HYDROcodone-acetaminophen 5-325  MG tablet Commonly known as: NORCO/VICODIN Take 1-2 tablets by mouth every 6 (six) hours as needed for moderate pain (pain score 4-6) or severe pain (pain score 7-10).   hydrOXYzine 25 MG tablet Commonly known as: ATARAX Take 25 mg by mouth See admin instructions. Take 1 tablet (25 mg) by mouth scheduled every morning, may take 2 additional dose every 8 hours if needed for anxiety   metoprolol  succinate 50 MG 24 hr tablet Commonly known as: TOPROL -XL Take 75 mg by mouth at bedtime. Take with or immediately following a meal.   omeprazole  40 MG capsule Commonly known as: PRILOSEC Take 1 capsule by mouth once daily   ramipril  10 MG capsule Commonly known as: ALTACE  Take 1 capsule by mouth once daily   sulfamethoxazole-trimethoprim 800-160 MG tablet Commonly known as: BACTRIM DS Take 1 tablet by mouth 2 (two) times daily. Start the day prior to foley removal appointment        Followup:   Follow-up Information     Alvaro Ricardo KATHEE Raddle., MD Follow up on 08/12/2024.   Specialty: Urology Why: at 9:15 for MD visit, pathology review, and catheter removal Contact information: [address] [place] [address] [phone]

## 2024-08-01 NOTE — Progress Notes (Signed)
 1 Day Post-Op Subjective: The patient is doing well.  No nausea or vomiting. Pain is adequately controlled. Passing gas, ambulated this morning. Tolerating CLD. Urine thin, amber.  Objective: Vital signs in last 24 hours: Temp:  [97.4 F (36.3 C)-98.2 F (36.8 C)] 97.6 F (36.4 C) (11/20 0420) Pulse Rate:  [74-95] 74 (11/20 0420) Resp:  [11-20] 20 (11/20 0420) BP: (113-165)/(57-97) 134/77 (11/20 0420) SpO2:  [90 %-100 %] 96 % (11/20 0420) Weight:  [96.6 kg-96.7 kg] 96.7 kg (11/20 0420)  Intake/Output from previous day: 11/19 0701 - 11/20 0700 In: 2876.3 [I.V.:1666.3; IV Piggyback:1200] Out: 880 [Urine:500; Drains:80; Blood:300] Intake/Output this shift: No intake/output data recorded.  Physical Exam:  General: Alert and oriented. CV: RRR Lungs: Clear bilaterally. GI: Soft, Nondistended. Incisions: Clean, dry, and intact Urine: Clear, thin amber Extremities: Nontender, no erythema, no edema.  Lab Results: Recent Labs    07/31/24 1653 08/01/24 0445  HGB 14.0 12.5*  HCT 41.4 36.6*   Cr 1.34 from 1.15.   Assessment/Plan: POD# 1 s/p robotic simple prostatectomy.  1) SL IVF 2) Ambulate, Incentive spirometry 3) Transition to regular diet 4) D/C pelvic drain 6) Plan for likely discharge later today    LOS: 0 days   Phillip Preston 08/01/2024, 7:32 AM

## 2024-08-01 NOTE — Plan of Care (Signed)
   Problem: Education: Goal: Knowledge of the procedure and recovery process will improve Outcome: Progressing   Problem: Bowel/Gastric: Goal: Gastrointestinal status for postoperative course will improve Outcome: Progressing   Problem: Pain Management: Goal: General experience of comfort will improve Outcome: Progressing   Problem: Skin Integrity: Goal: Demonstration of wound healing without infection will improve Outcome: Progressing   Problem: Urinary Elimination: Goal: Ability to avoid or minimize complications of infection will improve Outcome: Progressing Goal: Ability to achieve and maintain urine output will improve Outcome: Progressing Goal: Home care management will improve Outcome: Progressing   Problem: Education: Goal: Knowledge of General Education information will improve Description: Including pain rating scale, medication(s)/side effects and non-pharmacologic comfort measures Outcome: Progressing   Problem: Health Behavior/Discharge Planning: Goal: Ability to manage health-related needs will improve Outcome: Progressing   Problem: Clinical Measurements: Goal: Ability to maintain clinical measurements within normal limits will improve Outcome: Progressing Goal: Will remain free from infection Outcome: Progressing Goal: Diagnostic test results will improve Outcome: Progressing Goal: Respiratory complications will improve Outcome: Progressing Goal: Cardiovascular complication will be avoided Outcome: Progressing   Problem: Activity: Goal: Risk for activity intolerance will decrease Outcome: Progressing   Problem: Nutrition: Goal: Adequate nutrition will be maintained Outcome: Progressing   Problem: Coping: Goal: Level of anxiety will decrease Outcome: Progressing   Problem: Elimination: Goal: Will not experience complications related to bowel motility Outcome: Progressing Goal: Will not experience complications related to urinary  retention Outcome: Progressing   Problem: Pain Managment: Goal: General experience of comfort will improve and/or be controlled Outcome: Progressing   Problem: Safety: Goal: Ability to remain free from injury will improve Outcome: Progressing   Problem: Skin Integrity: Goal: Risk for impaired skin integrity will decrease Outcome: Progressing

## 2024-08-01 NOTE — Progress Notes (Signed)
 Pt walked the whole west side of the 4th floor. He was stable on his feet. Mild pain. Slept through the night. Having some gas pain.

## 2024-08-02 LAB — SURGICAL PATHOLOGY

## 2024-10-08 ENCOUNTER — Other Ambulatory Visit: Payer: Self-pay | Admitting: Cardiology
